# Patient Record
Sex: Female | Born: 1955 | Race: Black or African American | Hispanic: No | Smoking: Former smoker
Health system: Southern US, Community
[De-identification: ages and names within clinical notes are randomized; demographics above are authoritative.]

## PROBLEM LIST (undated history)

## (undated) DIAGNOSIS — M549 Dorsalgia, unspecified: Secondary | ICD-10-CM

## (undated) DIAGNOSIS — I10 Essential (primary) hypertension: Secondary | ICD-10-CM

## (undated) DIAGNOSIS — E66813 Obesity, class 3: Secondary | ICD-10-CM

## (undated) DIAGNOSIS — M1990 Unspecified osteoarthritis, unspecified site: Secondary | ICD-10-CM

## (undated) HISTORY — PX: TUBAL LIGATION: SHX77

## (undated) HISTORY — PX: TONSILLECTOMY: SUR1361

## (undated) HISTORY — DX: Essential (primary) hypertension: I10

## (undated) HISTORY — DX: Obesity, class 3: E66.813

## (undated) HISTORY — DX: Dorsalgia, unspecified: M54.9

## (undated) HISTORY — DX: Morbid (severe) obesity due to excess calories: E66.01

---

## 2005-07-24 ENCOUNTER — Ambulatory Visit: Payer: Self-pay | Admitting: Family Medicine

## 2005-08-02 ENCOUNTER — Ambulatory Visit (HOSPITAL_COMMUNITY): Admission: RE | Admit: 2005-08-02 | Discharge: 2005-08-02 | Payer: Self-pay | Admitting: Family Medicine

## 2005-08-28 ENCOUNTER — Ambulatory Visit: Payer: Self-pay | Admitting: Family Medicine

## 2007-07-18 ENCOUNTER — Encounter: Payer: Self-pay | Admitting: Family Medicine

## 2010-02-14 ENCOUNTER — Ambulatory Visit: Payer: Self-pay | Admitting: Orthopedic Surgery

## 2010-02-14 DIAGNOSIS — M19049 Primary osteoarthritis, unspecified hand: Secondary | ICD-10-CM | POA: Insufficient documentation

## 2010-02-14 DIAGNOSIS — M654 Radial styloid tenosynovitis [de Quervain]: Secondary | ICD-10-CM | POA: Insufficient documentation

## 2010-02-15 ENCOUNTER — Encounter: Payer: Self-pay | Admitting: Orthopedic Surgery

## 2010-03-01 ENCOUNTER — Telehealth: Payer: Self-pay | Admitting: Orthopedic Surgery

## 2010-03-24 ENCOUNTER — Telehealth: Payer: Self-pay | Admitting: Orthopedic Surgery

## 2010-08-16 NOTE — Letter (Signed)
Summary: rpc chart  rpc chart   Imported By: Curtis Sites 04/28/2010 15:57:04  _____________________________________________________________________  External Attachment:    Type:   Image     Comment:   External Document

## 2010-08-16 NOTE — Progress Notes (Signed)
Summary: Hand still hurting  Phone Note Call from Patient   Summary of Call: Julia Avery (June 01, 1956) says her hand is still hurting alot, to the point that she cannot do her daily activities.  The pain medicine is not helping much at all.  She did not bring the brace back to our office because she said she knew it is not the knid you said she needed because the thumb is out on the brace she has.  She used [facility]  Her# [phone] Initial call taken by: Jacklynn Ganong,  March 01, 2010 3:29 PM  Follow-up for Phone Call        okay I need to put her in a rhino brace if she is insured if not she can get one from the drugstore Follow-up by: Fuller Canada MD,  March 01, 2010 3:36 PM  Additional Follow-up for Phone Call Additional follow up Details #1::        she does not have insurance and I advised to get ryno from drug store, she said " and this is supposed to help my thumb pain"? I advised yes with use of Ibuprofen 800 tid and ice, she sounded upset, she said she needed something for pain, just FYI Additional Follow-up by: Ether Griffins,  March 01, 2010 3:41 PM

## 2010-08-16 NOTE — Progress Notes (Signed)
Summary: patient requests injection  Phone Note Call from Patient   Caller: Patient Summary of Call: Patient called to request cortisone injection in hand, as still hurting; states braces not helping much w/work and w/daily activities. Wants to know if injection can be given before she schedules appointment, as she is self pay. Initial call taken by: Cammie Sickle,  March 24, 2010 12:41 PM  Follow-up for Phone Call        this message doesnt make sense  Follow-up by: Fuller Canada MD,  March 24, 2010 1:24 PM  Additional Follow-up for Phone Call Additional follow up Details #1::        I explained to patient that Dr Romeo Apple needs to see hand to evaluate first, and offered appointment. Appt scheduled. Additional Follow-up by: Cammie Sickle,  March 24, 2010 2:07 PM

## 2010-08-16 NOTE — Letter (Signed)
Summary: History form  History form   Imported By: Jacklynn Ganong 02/15/2010 14:19:39  _____________________________________________________________________  External Attachment:    Type:   Image     Comment:   External Document

## 2010-08-16 NOTE — Assessment & Plan Note (Signed)
Summary: RT HAND PAIN CTS/AWARE TO PAY $100/BSF   Visit Type:  new patient  CC:  right hand pain.  History of Present Illness: I saw Julia Avery in the office today for an initial visit.  She is a 55 years old woman with the complaint of:  right hand pain.  Medications: none.  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  She had a history of carpal tunnel syndrome started in 1992 treated with an injection.  She uses her computer at work and complains of numbness and pain over the RIGHT thumb with a sharp throbbing burning sensation.  She reports intensity of 9/10 with constant pain unrelieved by a brace and a glove on the RIGHT hand.  She has trouble with writing, twisting, combing her hair, driving, using a remote.  Her pain was unrelieved by Aleve, Vicodin or Tylenol for  She has noted some swelling some tingling and some locking related to the RIGHT thumb      Allergies (verified): 1)  ! Penicillin  Past History:  Past Surgical History: Tonsillectomy Tubaligation  Review of Systems Constitutional:  Denies weight loss, weight gain, fever, chills, and fatigue. Cardiovascular:  Denies chest pain, palpitations, fainting, and murmurs. Respiratory:  Denies short of breath, wheezing, couch, tightness, pain on inspiration, and snoring . Gastrointestinal:  Complains of nausea and constipation; denies heartburn, vomiting, diarrhea, and blood in your stools. Genitourinary:  Complains of urgency; denies frequency, difficulty urinating, painful urination, flank pain, and bleeding in urine. Neurologic:  Denies numbness, tingling, unsteady gait, dizziness, tremors, and seizure. Musculoskeletal:  Denies joint pain, swelling, instability, stiffness, redness, heat, and muscle pain. Endocrine:  Denies excessive thirst, exessive urination, and heat or cold intolerance. Psychiatric:  Denies nervousness, depression,  anxiety, and hallucinations. Skin:  Denies changes in the skin, poor healing, rash, itching, and redness. HEENT:  Denies blurred or double vision, eye pain, redness, and watering. Immunology:  Denies seasonal allergies, sinus problems, and allergic to bee stings. Hemoatologic:  Denies easy bleeding and brusing.  Physical Exam  Skin:  intact without lesions or rashes Psych:  alert and cooperative; normal mood and affect; normal attention span and concentration   Wrist/Hand Exam  General:    Well-developed, well-nourished, in no acute distress; alert and oriented x 3.    Vital signs are stable  Inspection:    Inspection is normal.    Palpation:    she is tender over the RIGHT thumb over the first compartment shows a positive Finkelstein test decreased range of motion at the basilar joint and also at the metacarpophalangeal joint tenderness at the basal joint positive grinding test  Vascular:    Radial, ulnar, brachial, and axillary pulses 2+ and symmetric; capillary refill less than 2 seconds; no evidence of ischemia, clubbing, or cyanosis.    Sensory:    Gross sensation intact in the upper extremities.    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 1 by mouth bid  Patient Instructions: 1)  please bring brace by for [place] to see today Prescriptions: DICLOFENAC POTASSIUM 50 MG TABS (DICLOFENAC POTASSIUM) 1 by mouth bid  #60 x 1   Entered and Authorized by:   Fuller Canada MD   Signed by:   Fuller Canada MD on 02/14/2010   Method used:   Print then Give to Patient   RxID:   [id number]

## 2012-05-21 ENCOUNTER — Other Ambulatory Visit (HOSPITAL_COMMUNITY)
Admission: RE | Admit: 2012-05-21 | Discharge: 2012-05-21 | Disposition: A | Discharge status: Home / Self Care | Payer: 59 | Source: Ambulatory Visit | Attending: Family Medicine | Admitting: Family Medicine

## 2012-05-21 ENCOUNTER — Encounter: Payer: Self-pay | Admitting: Family Medicine

## 2012-05-21 ENCOUNTER — Ambulatory Visit (INDEPENDENT_AMBULATORY_CARE_PROVIDER_SITE_OTHER): Payer: 59 | Admitting: Family Medicine

## 2012-05-21 VITALS — BP 132/76 | HR 88 | Resp 18 | Ht 62.0 in | Wt 247.1 lb

## 2012-05-21 DIAGNOSIS — R3915 Urgency of urination: Secondary | ICD-10-CM

## 2012-05-21 DIAGNOSIS — Z720 Tobacco use: Secondary | ICD-10-CM | POA: Insufficient documentation

## 2012-05-21 DIAGNOSIS — F17200 Nicotine dependence, unspecified, uncomplicated: Secondary | ICD-10-CM

## 2012-05-21 DIAGNOSIS — E669 Obesity, unspecified: Secondary | ICD-10-CM | POA: Insufficient documentation

## 2012-05-21 DIAGNOSIS — M25559 Pain in unspecified hip: Secondary | ICD-10-CM | POA: Insufficient documentation

## 2012-05-21 DIAGNOSIS — I10 Essential (primary) hypertension: Secondary | ICD-10-CM

## 2012-05-21 DIAGNOSIS — N760 Acute vaginitis: Secondary | ICD-10-CM | POA: Insufficient documentation

## 2012-05-21 DIAGNOSIS — Z113 Encounter for screening for infections with a predominantly sexual mode of transmission: Secondary | ICD-10-CM | POA: Insufficient documentation

## 2012-05-21 LAB — POCT URINALYSIS DIPSTICK
Bilirubin, UA: NEGATIVE
Blood, UA: NEGATIVE
Glucose, UA: NEGATIVE
Ketones, UA: NEGATIVE
Leukocytes, UA: NEGATIVE
Nitrite, UA: NEGATIVE
Protein, UA: NEGATIVE
Spec Grav, UA: 1.02
Urobilinogen, UA: 0.2
pH, UA: 7

## 2012-05-21 MED ORDER — LISINOPRIL-HYDROCHLOROTHIAZIDE 10-12.5 MG PO TABS: 1.0000 | ORAL_TABLET | Freq: Every day | ORAL | Status: DC

## 2012-05-21 NOTE — Assessment & Plan Note (Signed)
Cultures taken, including GC/chlaymdia, discussed condom use, protecting self

## 2012-05-21 NOTE — Assessment & Plan Note (Signed)
likley due to weight exan benign, she has pain on and off, hold on imaging, continue acetaminophen

## 2012-05-21 NOTE — Progress Notes (Signed)
  Subjective:    Patient ID: Julia Avery, female    DOB: 1955-10-07, 56 y.o.   MRN: [id number]  HPI Patient here to establish care. She's not had a PCP in greater than 5 years. She was seen Dr. Roseanne Reno he. No GYN care in greater than 5 years. She did have an appointment with her eye doctor Dr. Mayford Knife 2 weeks ago.  Hypertension she's been on medications for the past 15 years. She had no PCP she's been taking a friend's blood pressure medication Norvasc 5 mg daily.  Pain on right side she's had pain on her right side a strain from her arm all the way down to her leg for the past 6 months. She thinks it is due to her increased weight gain. She has been down to 130 in her adult life but feels comfortable at 170.  Vaginal discharge with itching and odor for the past couple of months. She's also noted urinary urgency and frequency. She often has leaking episodes and she does not make it to the bathroom. She is in relationship with female partner who also has other partners   Review of Systems  GEN- denies fatigue, fever, weight loss,weakness, recent illness HEENT- denies eye drainage, change in vision, nasal discharge, CVS- denies chest pain, palpitations RESP- denies SOB, cough, wheeze ABD- denies N/V, change in stools, abd pain GU- denies dysuria, hematuria, dribbling, incontinence MSK- + joint pain, muscle aches, injury Neuro- denies headache, dizziness, syncope, seizure activity      Objective:   Physical Exam GEN- NAD, alert and oriented x3, obese HEENT- PERRL, EOMI, non injected sclera, pink conjunctiva, MMM, oropharynx clear Neck- Supple,  CVS- RRR, no murmur RESP-CTAB ABS-NABS,soft,NT,ND, no CVA tenderness MSK normal ROM HIP, neg SLR back, spine NT GEN- NAD, alert and oriented, Neck- supple, no thyromegaly GU- normal external genitalia, Skin- hypopigemenation at gluteal crease and skin below vulva, vaginal mucosa pink and moist, cervix visualized no growth, no blood form os,  +discharge, no CMT, no ovarian masses, uterus normal size EXT- No edema Pulses- Radial 2+ Psych-normal affect and Mood         Assessment & Plan:

## 2012-05-21 NOTE — Assessment & Plan Note (Signed)
Check fasting labs, start HCTZ-lisinopril

## 2012-05-21 NOTE — Patient Instructions (Signed)
Get the labs done today- we will discuss at next visit Start new blood pressure pill-  Stop the norvasc You need to quit smoking! Schedule PAP Smear in 4 weeks, will f/u BP

## 2012-05-21 NOTE — Assessment & Plan Note (Signed)
She has lost significant amount of weight in the past. She is motivated to lose weight however is always he's diet pills in the past. She last used Phen-Fen and lost 100 pounds. She is asking for diet pill today. I will review her labs and have her return for her physical exam. That time we'll discuss starting phentermine

## 2012-05-21 NOTE — Assessment & Plan Note (Signed)
UA neg, mild incontinence, f/u labs no meds at this time, not a daily occurrence for her

## 2012-05-23 MED ORDER — METRONIDAZOLE 500 MG PO TABS: 500.0000 mg | ORAL_TABLET | Freq: Two times a day (BID) | ORAL | Status: AC

## 2012-05-23 NOTE — Addendum Note (Signed)
Addended by: Milinda Antis F on: 05/23/2012 05:15 PM   Modules accepted: Orders

## 2012-06-03 ENCOUNTER — Telehealth: Payer: Self-pay | Admitting: Family Medicine

## 2012-06-03 NOTE — Telephone Encounter (Signed)
Patient states that she took blood pressure medicine prescribed on 11/5 for 7 days and she had nausea, fatigue, feelings of faintness, and cold sweats.  She stopped the medicine for 2 days and them symptoms subsided.  She restarted the medicine today and the symptoms returned.  Advised to hold medicine until further contacted.

## 2012-06-04 MED ORDER — AMLODIPINE BESYLATE 5 MG PO TABS: 5.0000 mg | ORAL_TABLET | Freq: Every day | ORAL | Status: DC

## 2012-06-04 NOTE — Addendum Note (Signed)
Addended by: Milinda Antis F on: 06/04/2012 12:46 PM   Modules accepted: Orders

## 2012-06-04 NOTE — Telephone Encounter (Signed)
Stop lisinopril HCTZ, start norvasc 5

## 2012-06-04 NOTE — Telephone Encounter (Signed)
Patient aware.

## 2012-06-18 ENCOUNTER — Ambulatory Visit: Payer: 59 | Admitting: Family Medicine

## 2012-06-28 ENCOUNTER — Ambulatory Visit: Payer: 59 | Admitting: Family Medicine

## 2012-11-07 ENCOUNTER — Ambulatory Visit (INDEPENDENT_AMBULATORY_CARE_PROVIDER_SITE_OTHER): Payer: 59 | Admitting: Family Medicine

## 2012-11-07 ENCOUNTER — Encounter: Payer: Self-pay | Admitting: Family Medicine

## 2012-11-07 ENCOUNTER — Ambulatory Visit (HOSPITAL_COMMUNITY)
Admission: RE | Admit: 2012-11-07 | Discharge: 2012-11-07 | Disposition: A | Discharge status: Home / Self Care | Payer: 59 | Source: Ambulatory Visit | Attending: Family Medicine | Admitting: Family Medicine

## 2012-11-07 VITALS — BP 134/76 | HR 84 | Resp 18 | Ht 62.0 in | Wt 252.0 lb

## 2012-11-07 DIAGNOSIS — R7989 Other specified abnormal findings of blood chemistry: Secondary | ICD-10-CM

## 2012-11-07 DIAGNOSIS — I10 Essential (primary) hypertension: Secondary | ICD-10-CM

## 2012-11-07 DIAGNOSIS — M79609 Pain in unspecified limb: Secondary | ICD-10-CM | POA: Insufficient documentation

## 2012-11-07 DIAGNOSIS — R791 Abnormal coagulation profile: Secondary | ICD-10-CM

## 2012-11-07 DIAGNOSIS — M25569 Pain in unspecified knee: Secondary | ICD-10-CM

## 2012-11-07 NOTE — Progress Notes (Signed)
  Subjective:    Patient ID: Julia Avery, female    DOB: 1955-11-27, 57 y.o.   MRN: [id number]  HPI   patient presents with muscle pain and aches for the past couple weeks. She was evaluated in emergency room labs were unremarkable with exception of elevated d-dimer she was given one injection of Lovenox and told to followup with her primary care for ultrasound of the legs however this was 9 days ago. She continues to have pain in her legs refill very sore when she gets up to move around. She denies any injury denies back pain hip pain burning or tingling in the feet. No new medications. Of note she recently restarted her blood pressure pill 5 days ago  Review of Systems  GEN- denies fatigue, fever, weight loss,weakness, recent illness HEENT- denies eye drainage, change in vision, nasal discharge, CVS- denies chest pain, palpitations RESP- denies SOB, cough, wheeze ABD- denies N/V, change in stools, abd pain GU- denies dysuria, hematuria, dribbling, incontinence MSK- denies joint pain,+ muscle aches, injury Neuro- denies headache, dizziness, syncope, seizure activity      Objective:   Physical Exam GEN- NAD, alert and oriented x3 HEENT- PERRL, EOMI, non injected sclera, pink conjunctiva, MMM, oropharynx clear CVS- RRR, no murmur RESP-CTAB EXT- No edema, TTP Left calf > R, mild TTP bilateral thighs, Skin- no skin lesions lower legs Pulses- Radial, DP- 2+        Assessment & Plan:

## 2012-11-07 NOTE — Patient Instructions (Addendum)
Get the ultrasound done today We will call about the blood pressure medication You can take advil ibuprofen for your legs F/U 2 weeks for physical with PAP Smear - Do not eat after midnight

## 2012-11-10 DIAGNOSIS — M25569 Pain in unspecified knee: Secondary | ICD-10-CM | POA: Insufficient documentation

## 2012-11-10 NOTE — Assessment & Plan Note (Signed)
Elevated d dimer, bilateral legs, no DVT Possible due to muscle cramps/ overuse Other labs unremarkable from [facility], start on NSAIDS Recheck at f/u in 2 weeks

## 2012-11-10 NOTE — Assessment & Plan Note (Signed)
Called to verify with pharmacy only paying $6 for meds, continue, fasting labs due

## 2012-11-11 ENCOUNTER — Telehealth: Payer: Self-pay | Admitting: Family Medicine

## 2012-11-11 DIAGNOSIS — M7918 Myalgia, other site: Secondary | ICD-10-CM

## 2012-11-11 DIAGNOSIS — I10 Essential (primary) hypertension: Secondary | ICD-10-CM

## 2012-11-12 ENCOUNTER — Telehealth: Payer: Self-pay | Admitting: Family Medicine

## 2012-11-13 NOTE — Telephone Encounter (Signed)
advil not helping. Wants something else for her leg pain

## 2012-11-14 MED ORDER — CYCLOBENZAPRINE HCL 5 MG PO TABS: 5.0000 mg | ORAL_TABLET | Freq: Three times a day (TID) | ORAL | Status: DC | PRN

## 2012-11-14 MED ORDER — TRAMADOL HCL 50 MG PO TABS: 50.0000 mg | ORAL_TABLET | Freq: Four times a day (QID) | ORAL | Status: DC | PRN

## 2012-11-14 NOTE — Telephone Encounter (Signed)
Please have pt schedule appt for her physical and leg pain She needs to get the labs done before the visit and needs to be fasting, this will look at other reasons for her leg pain Ultram sent for pain and flexeril

## 2012-11-14 NOTE — Telephone Encounter (Signed)
Spoke with patient and she is aware of meds being sent in to pharmacy.  She will try this and see if it elevates pain

## 2012-12-03 ENCOUNTER — Other Ambulatory Visit: Payer: Self-pay | Admitting: Family Medicine

## 2012-12-06 ENCOUNTER — Telehealth: Payer: Self-pay | Admitting: Family Medicine

## 2012-12-06 NOTE — Telephone Encounter (Signed)
Left message for patient to contact pharmacy.  Refill sent on 5/20

## 2012-12-10 ENCOUNTER — Other Ambulatory Visit (HOSPITAL_COMMUNITY)
Admission: RE | Admit: 2012-12-10 | Discharge: 2012-12-10 | Disposition: A | Discharge status: Home / Self Care | Payer: 59 | Source: Ambulatory Visit | Attending: Family Medicine | Admitting: Family Medicine

## 2012-12-10 ENCOUNTER — Encounter: Payer: Self-pay | Admitting: Family Medicine

## 2012-12-10 ENCOUNTER — Ambulatory Visit (INDEPENDENT_AMBULATORY_CARE_PROVIDER_SITE_OTHER): Payer: 59 | Admitting: Family Medicine

## 2012-12-10 VITALS — BP 120/86 | HR 82 | Resp 16 | Wt 245.1 lb

## 2012-12-10 DIAGNOSIS — Z01419 Encounter for gynecological examination (general) (routine) without abnormal findings: Secondary | ICD-10-CM | POA: Insufficient documentation

## 2012-12-10 DIAGNOSIS — M25569 Pain in unspecified knee: Secondary | ICD-10-CM

## 2012-12-10 DIAGNOSIS — IMO0001 Reserved for inherently not codable concepts without codable children: Secondary | ICD-10-CM

## 2012-12-10 DIAGNOSIS — I10 Essential (primary) hypertension: Secondary | ICD-10-CM

## 2012-12-10 DIAGNOSIS — M25561 Pain in right knee: Secondary | ICD-10-CM

## 2012-12-10 DIAGNOSIS — Z1231 Encounter for screening mammogram for malignant neoplasm of breast: Secondary | ICD-10-CM

## 2012-12-10 DIAGNOSIS — M791 Myalgia, unspecified site: Secondary | ICD-10-CM

## 2012-12-10 DIAGNOSIS — Z1211 Encounter for screening for malignant neoplasm of colon: Secondary | ICD-10-CM

## 2012-12-10 DIAGNOSIS — M25562 Pain in left knee: Secondary | ICD-10-CM

## 2012-12-10 DIAGNOSIS — Z124 Encounter for screening for malignant neoplasm of cervix: Secondary | ICD-10-CM

## 2012-12-10 MED ORDER — CYCLOBENZAPRINE HCL 5 MG PO TABS: 5.0000 mg | ORAL_TABLET | Freq: Three times a day (TID) | ORAL | Status: AC | PRN

## 2012-12-10 MED ORDER — TRAMADOL HCL 50 MG PO TABS: ORAL_TABLET | ORAL | Status: DC

## 2012-12-10 NOTE — Assessment & Plan Note (Signed)
Patient here for physical exam. Pap smear was done. She's been referred for colonoscopy. Mammogram is to be set up. She will return for fasting labs. Her Pap smear was normal she does not need another one for the next 2-3 years. Tetanus booster is up-to-date

## 2012-12-10 NOTE — Assessment & Plan Note (Signed)
Bilateral knee pain will obtain plain films
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
## 2012-12-10 NOTE — Assessment & Plan Note (Signed)
She has lost 7 pounds since her last visit continue to work on activity as tolerated

## 2012-12-10 NOTE — Assessment & Plan Note (Signed)
She continues to have joint pain and muscle aches. He advised her the importance of getting her labs done which will include anti-inflammatory markers. She's been using too much medication she states help her jittery the day. She will continue the muscle relaxant as prescribed also increase her tramadol to 100 mg every 6 hours as needed. I will not refill the hydrocodone at this time that she received from the emergency department

## 2012-12-10 NOTE — Patient Instructions (Signed)
Continue to work on weight loss He can take 1-2 tablets of tramadol every 6 hours as needed Continue muscle relaxants as needed  get the get the x-rays done of your knees at [facility] Please schedule your mammogram He will have a referral to GI for a colonoscopy Get the labs done fasting, this is for both your physical and your leg pain F/U 4 months

## 2012-12-10 NOTE — Assessment & Plan Note (Signed)
Myalgias in her legs per above, she has no change in her tone or neurological exam this may be due to obesity versus some referred pain from her knees

## 2012-12-10 NOTE — Assessment & Plan Note (Signed)
Blood pressure much improved 

## 2012-12-16 ENCOUNTER — Encounter: Payer: Self-pay | Admitting: Family Medicine

## 2012-12-18 ENCOUNTER — Telehealth: Payer: Self-pay

## 2012-12-18 NOTE — Telephone Encounter (Signed)
Called pt to schedule her colonoscopy. She said she is having problems with her insurance at this time, and she will call when she is ready. Said she is not having any GI problems, she just needs to have a screening colonoscopy.

## 2013-01-01 NOTE — Telephone Encounter (Signed)
Reminder sent to call when ready.

## 2013-03-27 ENCOUNTER — Telehealth: Payer: Self-pay | Admitting: Family Medicine

## 2013-03-27 NOTE — Telephone Encounter (Signed)
Ok to refill 

## 2013-03-27 NOTE — Telephone Encounter (Signed)
Tramadol HCL 50 mg tab 1-2 q6 hours prn pain #90 last rf 02/04/13

## 2013-03-28 MED ORDER — TRAMADOL HCL 50 MG PO TABS: ORAL_TABLET | ORAL | Status: DC

## 2013-03-28 NOTE — Telephone Encounter (Signed)
Okay to refill? 

## 2013-03-28 NOTE — Telephone Encounter (Signed)
Meds refilled.

## 2013-07-18 ENCOUNTER — Encounter: Payer: Self-pay | Admitting: Family Medicine

## 2013-07-18 ENCOUNTER — Telehealth: Payer: Self-pay | Admitting: Family Medicine

## 2013-07-18 MED ORDER — AMLODIPINE BESYLATE 5 MG PO TABS: 5.0000 mg | ORAL_TABLET | Freq: Every day | ORAL | Status: DC

## 2013-07-18 NOTE — Telephone Encounter (Signed)
Medication refill for one time only.  Patient needs to be seen.  Letter sent for patient to call and schedule 

## 2014-03-27 ENCOUNTER — Telehealth: Payer: Self-pay | Admitting: *Deleted

## 2014-03-27 ENCOUNTER — Ambulatory Visit (INDEPENDENT_AMBULATORY_CARE_PROVIDER_SITE_OTHER): Payer: Self-pay | Admitting: Family Medicine

## 2014-03-27 ENCOUNTER — Encounter: Payer: Self-pay | Admitting: Family Medicine

## 2014-03-27 VITALS — BP 148/76 | HR 72 | Temp 98.2°F | Resp 14 | Ht 61.0 in | Wt 256.0 lb

## 2014-03-27 DIAGNOSIS — S8000XA Contusion of unspecified knee, initial encounter: Secondary | ICD-10-CM

## 2014-03-27 DIAGNOSIS — M549 Dorsalgia, unspecified: Secondary | ICD-10-CM | POA: Insufficient documentation

## 2014-03-27 DIAGNOSIS — S8002XD Contusion of left knee, subsequent encounter: Secondary | ICD-10-CM

## 2014-03-27 DIAGNOSIS — M25559 Pain in unspecified hip: Secondary | ICD-10-CM

## 2014-03-27 DIAGNOSIS — M25551 Pain in right hip: Secondary | ICD-10-CM

## 2014-03-27 DIAGNOSIS — Z5189 Encounter for other specified aftercare: Secondary | ICD-10-CM

## 2014-03-27 DIAGNOSIS — M5430 Sciatica, unspecified side: Secondary | ICD-10-CM

## 2014-03-27 DIAGNOSIS — M5441 Lumbago with sciatica, right side: Secondary | ICD-10-CM

## 2014-03-27 MED ORDER — AMLODIPINE BESYLATE 5 MG PO TABS: 5.0000 mg | ORAL_TABLET | Freq: Every day | ORAL | Status: DC

## 2014-03-27 MED ORDER — HYDROCODONE-ACETAMINOPHEN 7.5-325 MG PO TABS: 1.0000 | ORAL_TABLET | Freq: Four times a day (QID) | ORAL | Status: DC | PRN

## 2014-03-27 MED ORDER — MELOXICAM 15 MG PO TABS: 15.0000 mg | ORAL_TABLET | Freq: Every day | ORAL | Status: DC

## 2014-03-27 MED ORDER — CYCLOBENZAPRINE HCL 10 MG PO TABS: 10.0000 mg | ORAL_TABLET | Freq: Three times a day (TID) | ORAL | Status: DC | PRN

## 2014-03-27 NOTE — Telephone Encounter (Signed)
Pt called stating needing meds refilled came in for office visit today, refilled 90 day suppley with 1 refill
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
## 2014-03-27 NOTE — Patient Instructions (Addendum)
Release of records-- [facility] ER from [place] of back and hip at [facility] on Light Duty  F/U 4 weeks

## 2014-03-28 DIAGNOSIS — S8002XA Contusion of left knee, initial encounter: Secondary | ICD-10-CM | POA: Insufficient documentation

## 2014-03-28 NOTE — Assessment & Plan Note (Signed)
persistant pain though her gait has improved, will obtain xray of back and hip based on exam and mechanism of fall with the trauma Continue meloxicam and trial of flexeril due to cost of skelaxin Hydrocodone for bedtime use for pain Hold on neurosurgery referral

## 2014-03-28 NOTE — Assessment & Plan Note (Signed)
Improved without intervention, I do not think ortho is needed at this time, her knee is no longer giving her any significant pain
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
## 2014-03-31 ENCOUNTER — Telehealth: Payer: Self-pay | Admitting: *Deleted

## 2014-03-31 NOTE — Telephone Encounter (Signed)
Received call from Jeannetta Ellis, [employer] of [employer]. [phone].  Reports that letter received from MD for light duty was received. Requested more in depth letter about what patient can perform while on light duty and how many hours patient can work.   Advised to fax a copy of patient job description and responsibilities.   MD to be made aware.

## 2014-03-31 NOTE — Telephone Encounter (Signed)
Will send new letter, once job description sent to me

## 2014-04-01 ENCOUNTER — Encounter: Payer: Self-pay | Admitting: Family Medicine

## 2014-04-10 ENCOUNTER — Encounter: Payer: Self-pay | Admitting: Family Medicine

## 2014-04-10 ENCOUNTER — Telehealth: Payer: Self-pay | Admitting: Family Medicine

## 2014-04-10 NOTE — Telephone Encounter (Signed)
I have called the pt twice over the past couple days using all numbers provided, Home number is not in service Also attempted the number for [employer], regarding her work restrictions.  I will fax in a new note with same restrictions with exception of ability to work 8 hours now

## 2014-04-24 ENCOUNTER — Ambulatory Visit: Payer: Self-pay | Admitting: Family Medicine

## 2014-04-27 ENCOUNTER — Ambulatory Visit (INDEPENDENT_AMBULATORY_CARE_PROVIDER_SITE_OTHER): Payer: Self-pay | Admitting: Family Medicine

## 2014-04-27 ENCOUNTER — Encounter: Payer: Self-pay | Admitting: Family Medicine

## 2014-04-27 VITALS — BP 136/74 | HR 72 | Temp 98.3°F | Resp 14 | Ht 62.0 in | Wt 252.0 lb

## 2014-04-27 DIAGNOSIS — M5441 Lumbago with sciatica, right side: Secondary | ICD-10-CM

## 2014-04-27 DIAGNOSIS — I10 Essential (primary) hypertension: Secondary | ICD-10-CM

## 2014-04-27 LAB — CBC WITH DIFFERENTIAL/PLATELET
Basophils Absolute: 0.1 10*3/uL (ref 0.0–0.1)
Basophils Relative: 1 % (ref 0–1)
Eosinophils Absolute: 0.5 10*3/uL (ref 0.0–0.7)
Eosinophils Relative: 7 % — ABNORMAL HIGH (ref 0–5)
HCT: 40 % (ref 36.0–46.0)
Hemoglobin: 13.7 g/dL (ref 12.0–15.0)
Lymphocytes Relative: 42 % (ref 12–46)
Lymphs Abs: 3.1 10*3/uL (ref 0.7–4.0)
MCH: 29.8 pg (ref 26.0–34.0)
MCHC: 34.3 g/dL (ref 30.0–36.0)
MCV: 87.1 fL (ref 78.0–100.0)
Monocytes Absolute: 0.6 10*3/uL (ref 0.1–1.0)
Monocytes Relative: 8 % (ref 3–12)
Neutro Abs: 3.1 10*3/uL (ref 1.7–7.7)
Neutrophils Relative %: 42 % — ABNORMAL LOW (ref 43–77)
Platelets: 432 10*3/uL — ABNORMAL HIGH (ref 150–400)
RBC: 4.59 MIL/uL (ref 3.87–5.11)
RDW: 15.8 % — ABNORMAL HIGH (ref 11.5–15.5)
WBC: 7.3 10*3/uL (ref 4.0–10.5)

## 2014-04-27 LAB — LIPID PANEL
Cholesterol: 173 mg/dL (ref 0–200)
HDL: 62 mg/dL (ref >39)
LDL Cholesterol: 100 mg/dL — ABNORMAL HIGH (ref 0–99)
Total CHOL/HDL Ratio: 2.8 Ratio
Triglycerides: 57 mg/dL (ref <150)
VLDL: 11 mg/dL (ref 0–40)

## 2014-04-27 LAB — COMPREHENSIVE METABOLIC PANEL
ALT: 25 U/L (ref 0–35)
AST: 21 U/L (ref 0–37)
Albumin: 4.4 g/dL (ref 3.5–5.2)
Alkaline Phosphatase: 108 U/L (ref 39–117)
BUN: 12 mg/dL (ref 6–23)
CO2: 28 mEq/L (ref 19–32)
Calcium: 9.5 mg/dL (ref 8.4–10.5)
Chloride: 104 mEq/L (ref 96–112)
Creat: 0.71 mg/dL (ref 0.50–1.10)
Glucose, Bld: 94 mg/dL (ref 70–99)
Potassium: 3.8 mEq/L (ref 3.5–5.3)
Sodium: 143 mEq/L (ref 135–145)
Total Bilirubin: 0.3 mg/dL (ref 0.2–1.2)
Total Protein: 7.2 g/dL (ref 6.0–8.3)

## 2014-04-27 MED ORDER — CYCLOBENZAPRINE HCL 10 MG PO TABS: 10.0000 mg | ORAL_TABLET | Freq: Three times a day (TID) | ORAL | Status: DC | PRN

## 2014-04-27 MED ORDER — MELOXICAM 15 MG PO TABS: 15.0000 mg | ORAL_TABLET | Freq: Every day | ORAL | Status: DC

## 2014-04-27 MED ORDER — AMLODIPINE BESYLATE 5 MG PO TABS: 5.0000 mg | ORAL_TABLET | Freq: Every day | ORAL | Status: DC
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
## 2014-04-27 NOTE — Patient Instructions (Addendum)
Continue current medicatons  F/U 6 months

## 2014-04-27 NOTE — Assessment & Plan Note (Signed)
ROM and pain improved, release back to work , no resctrictions, continue muscle relaxer prn and NSAIDS

## 2014-04-27 NOTE — Assessment & Plan Note (Signed)
Well controlled 

## 2014-04-28 ENCOUNTER — Encounter: Payer: Self-pay | Admitting: *Deleted

## 2014-05-05 ENCOUNTER — Encounter: Payer: Self-pay | Admitting: Family Medicine

## 2014-05-14 ENCOUNTER — Telehealth: Payer: Self-pay | Admitting: Family Medicine

## 2014-05-14 NOTE — Telephone Encounter (Signed)
Okay to give norco 5-325mg  1 po q 4hours prn pain #30

## 2014-05-14 NOTE — Telephone Encounter (Signed)
[phone] Pt is needing a prescription for a pain pill (what she was given last time is fine)

## 2014-05-14 NOTE — Telephone Encounter (Signed)
MD please advise

## 2014-05-15 MED ORDER — HYDROCODONE-ACETAMINOPHEN 5-325 MG PO TABS: 1.0000 | ORAL_TABLET | ORAL | Status: DC | PRN

## 2014-05-15 NOTE — Telephone Encounter (Signed)
RX printed, pt aware

## 2014-06-18 ENCOUNTER — Telehealth: Payer: Self-pay | Admitting: Family Medicine

## 2014-06-18 ENCOUNTER — Other Ambulatory Visit: Payer: Self-pay | Admitting: Family Medicine

## 2014-06-18 NOTE — Telephone Encounter (Signed)
Ok to refill??  Last office visit 04/27/2014.  Last refill 05/15/2014.

## 2014-06-18 NOTE — Telephone Encounter (Signed)
Ok to refill??  Last office visit/ refill 04/27/2014, #1 refill.

## 2014-06-18 NOTE — Telephone Encounter (Signed)
Patient is calling to get rx for her hydrocodone  [phone]

## 2014-06-19 MED ORDER — HYDROCODONE-ACETAMINOPHEN 5-325 MG PO TABS: 1.0000 | ORAL_TABLET | ORAL | Status: DC | PRN

## 2014-06-19 NOTE — Telephone Encounter (Signed)
Call placed to patient and patient made aware.  

## 2014-06-19 NOTE — Telephone Encounter (Signed)
RX called in .

## 2014-06-19 NOTE — Telephone Encounter (Signed)
Prescription printed and patient made aware to come to office to pick up.  

## 2014-06-19 NOTE — Telephone Encounter (Signed)
Okay to refill? 

## 2014-06-19 NOTE — Telephone Encounter (Signed)
Okay to give this refill, advise her to try to taper off the pain medications, and use the NSAIDS as needed,

## 2014-08-02 ENCOUNTER — Other Ambulatory Visit: Payer: Self-pay | Admitting: Family Medicine

## 2014-08-03 NOTE — Telephone Encounter (Signed)
Refill appropriate and filled per protocol. 

## 2014-10-27 ENCOUNTER — Ambulatory Visit (INDEPENDENT_AMBULATORY_CARE_PROVIDER_SITE_OTHER): Payer: Self-pay | Admitting: Family Medicine

## 2014-10-27 ENCOUNTER — Encounter: Payer: Self-pay | Admitting: Family Medicine

## 2014-10-27 VITALS — BP 128/62 | HR 84 | Temp 98.3°F | Resp 16 | Ht 62.0 in | Wt 258.0 lb

## 2014-10-27 DIAGNOSIS — M25569 Pain in unspecified knee: Secondary | ICD-10-CM

## 2014-10-27 DIAGNOSIS — M7552 Bursitis of left shoulder: Secondary | ICD-10-CM

## 2014-10-27 DIAGNOSIS — I10 Essential (primary) hypertension: Secondary | ICD-10-CM

## 2014-10-27 MED ORDER — MELOXICAM 15 MG PO TABS: 15.0000 mg | ORAL_TABLET | Freq: Every day | ORAL | Status: DC

## 2014-10-27 MED ORDER — HYDROCODONE-ACETAMINOPHEN 5-325 MG PO TABS: 1.0000 | ORAL_TABLET | ORAL | Status: DC | PRN

## 2014-10-27 MED ORDER — AMLODIPINE BESYLATE 5 MG PO TABS: 5.0000 mg | ORAL_TABLET | Freq: Every day | ORAL | Status: DC

## 2014-10-27 MED ORDER — CYCLOBENZAPRINE HCL 10 MG PO TABS: 10.0000 mg | ORAL_TABLET | Freq: Three times a day (TID) | ORAL | Status: DC | PRN

## 2014-10-27 MED ORDER — METHYLPREDNISOLONE (PAK) 4 MG PO TABS: ORAL_TABLET | ORAL | Status: DC
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
## 2014-10-27 NOTE — Assessment & Plan Note (Signed)
Discussed need for dietary changes, she wants to start weight watchers, which I agree with Weight loss will also help her joint pain

## 2014-10-27 NOTE — Patient Instructions (Addendum)
Work on low fat diet, portion control Drink 8 cups of water Medrol dosepak Try the Ice the shoulder  Call if not better   F/U 6 months

## 2014-10-27 NOTE — Assessment & Plan Note (Signed)
persistant pain though improved, no back pain currently, Initially I considered MRI but she doing all of her regular activities so we will hold off and she can use of meds as needed

## 2014-10-27 NOTE — Assessment & Plan Note (Signed)
Well controlled 

## 2014-11-03 ENCOUNTER — Telehealth: Payer: Self-pay | Admitting: *Deleted

## 2014-11-03 DIAGNOSIS — M7551 Bursitis of right shoulder: Secondary | ICD-10-CM

## 2014-11-03 DIAGNOSIS — M25512 Pain in left shoulder: Secondary | ICD-10-CM

## 2014-11-03 NOTE — Telephone Encounter (Signed)
Pt called stating was seen last Tuesday with pain in left arm, states she is still having pain in left arm and does not seem to get any relief even when she takes pain medication. Wants to know what needs to be done. Please advise!

## 2014-11-03 NOTE — Telephone Encounter (Signed)
Referral to ortho, for left shoulder pain, bursitis

## 2014-11-04 NOTE — Telephone Encounter (Signed)
Pt aware referring to ortho, pt wants to go to someone in [place]  Referral placed to Orthopedic

## 2014-11-11 ENCOUNTER — Telehealth: Payer: Self-pay | Admitting: *Deleted

## 2014-11-11 NOTE — Telephone Encounter (Signed)
pt has appt scheduled at [facility] on May 4 at 2pm with Dr. Case, if pt does not have insurance she will need to pay the 125.00 dollar downpayment at time of service. [place]

## 2014-11-16 NOTE — Telephone Encounter (Signed)
lmtrc

## 2014-11-30 ENCOUNTER — Encounter: Payer: Self-pay | Admitting: Family Medicine

## 2015-01-13 ENCOUNTER — Encounter: Payer: Self-pay | Admitting: Family Medicine

## 2015-03-26 ENCOUNTER — Telehealth: Payer: Self-pay | Admitting: Family Medicine

## 2015-03-26 MED ORDER — HYDROCODONE-ACETAMINOPHEN 5-325 MG PO TABS
1.0000 | ORAL_TABLET | ORAL | Status: DC | PRN
Start: 2015-03-26 — End: 2015-04-28

## 2015-03-26 NOTE — Telephone Encounter (Signed)
Prescription printed and patient made aware to come to office to pick up before 5 pm on 03/26/2015 per VM.

## 2015-03-26 NOTE — Telephone Encounter (Signed)
Okay to refill? 

## 2015-03-26 NOTE — Telephone Encounter (Signed)
Ok to refill??  Last office visit/ refill 10/27/2014.

## 2015-03-26 NOTE — Telephone Encounter (Signed)
Patient missed appt but needs rx for her hydrocodone if possible she rescheduled  [phone]

## 2015-03-29 ENCOUNTER — Other Ambulatory Visit: Payer: Self-pay | Admitting: Family Medicine

## 2015-03-29 NOTE — Telephone Encounter (Signed)
Okay to refill give 2 

## 2015-03-29 NOTE — Telephone Encounter (Signed)
Prescription sent to pharmacy.

## 2015-03-29 NOTE — Telephone Encounter (Signed)
Ok to refill 

## 2015-04-28 ENCOUNTER — Encounter: Payer: Self-pay | Admitting: Family Medicine

## 2015-04-28 ENCOUNTER — Ambulatory Visit (INDEPENDENT_AMBULATORY_CARE_PROVIDER_SITE_OTHER): Payer: Self-pay | Admitting: Family Medicine

## 2015-04-28 VITALS — BP 128/78 | HR 80 | Temp 98.2°F | Resp 16 | Ht 62.0 in | Wt 259.0 lb

## 2015-04-28 DIAGNOSIS — J209 Acute bronchitis, unspecified: Secondary | ICD-10-CM

## 2015-04-28 DIAGNOSIS — Z720 Tobacco use: Secondary | ICD-10-CM

## 2015-04-28 DIAGNOSIS — I10 Essential (primary) hypertension: Secondary | ICD-10-CM

## 2015-04-28 DIAGNOSIS — M25561 Pain in right knee: Secondary | ICD-10-CM

## 2015-04-28 DIAGNOSIS — M25562 Pain in left knee: Secondary | ICD-10-CM

## 2015-04-28 MED ORDER — HYDROCODONE-ACETAMINOPHEN 7.5-325 MG PO TABS: 1.0000 | ORAL_TABLET | Freq: Four times a day (QID) | ORAL | Status: DC | PRN

## 2015-04-28 MED ORDER — METHOCARBAMOL 500 MG PO TABS: 500.0000 mg | ORAL_TABLET | Freq: Three times a day (TID) | ORAL | Status: DC | PRN

## 2015-04-28 MED ORDER — AZITHROMYCIN 250 MG PO TABS: ORAL_TABLET | ORAL | Status: DC

## 2015-04-28 NOTE — Assessment & Plan Note (Signed)
Pressure controlled. She needs fasting labs however she is uninsured she is signing up for insurance during open enrollment we will do these at her next visit

## 2015-04-28 NOTE — Patient Instructions (Addendum)
Robitussin DM  Take antibiotics Use Vape Pain medication- last refill New muscle relaxer Robaxin  Note for work - out tomorrow, can return on Friday F/U 6 months
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
## 2015-06-18 ENCOUNTER — Telehealth: Payer: Self-pay | Admitting: Family Medicine

## 2015-06-18 NOTE — Telephone Encounter (Signed)
Patient states that the muscle relaxer's you prescribed to her isn't helping with the pain and she is requesting a pain medication. Please advise.

## 2015-06-18 NOTE — Telephone Encounter (Signed)
Patient needs OV.   Call placed to patient to attempt to schedule OV.   No answer and no VM.

## 2015-07-26 ENCOUNTER — Ambulatory Visit: Payer: Self-pay | Admitting: Family Medicine

## 2015-08-09 ENCOUNTER — Ambulatory Visit: Payer: Self-pay | Admitting: Family Medicine

## 2015-08-13 ENCOUNTER — Ambulatory Visit (INDEPENDENT_AMBULATORY_CARE_PROVIDER_SITE_OTHER): Payer: BLUE CROSS/BLUE SHIELD | Admitting: Family Medicine

## 2015-08-13 ENCOUNTER — Encounter: Payer: Self-pay | Admitting: Family Medicine

## 2015-08-13 VITALS — BP 140/72 | HR 88 | Temp 99.0°F | Resp 16 | Ht 62.0 in | Wt 256.0 lb

## 2015-08-13 DIAGNOSIS — M5431 Sciatica, right side: Secondary | ICD-10-CM | POA: Diagnosis not present

## 2015-08-13 DIAGNOSIS — G8929 Other chronic pain: Secondary | ICD-10-CM | POA: Insufficient documentation

## 2015-08-13 DIAGNOSIS — M549 Dorsalgia, unspecified: Secondary | ICD-10-CM | POA: Insufficient documentation

## 2015-08-13 MED ORDER — OXYCODONE-ACETAMINOPHEN 5-325 MG PO TABS: 1.0000 | ORAL_TABLET | Freq: Four times a day (QID) | ORAL | Status: DC | PRN

## 2015-08-13 MED ORDER — DIAZEPAM 5 MG PO TABS: ORAL_TABLET | ORAL | Status: DC

## 2015-08-13 MED ORDER — DICLOFENAC SODIUM 75 MG PO TBEC
75.0000 mg | DELAYED_RELEASE_TABLET | Freq: Two times a day (BID) | ORAL | Status: DC
Start: 2015-08-13 — End: 2015-10-18
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
## 2015-08-13 NOTE — Assessment & Plan Note (Signed)
Recurrent episodes of pain,  The mobility. I'm going to proceed with MRI of the lumbar spine. I reviewed her prescriptions for Percocet. We will stop muscle laxity is just causing oversedation with pain. Also give her diclofenac as an anti-inflammatory she can stop the over-the-counter NSAIDs. She was given Valium to take an hour before her MRI.

## 2015-08-31 ENCOUNTER — Encounter: Payer: Self-pay | Admitting: Family Medicine

## 2015-09-01 ENCOUNTER — Ambulatory Visit (HOSPITAL_COMMUNITY): Payer: BLUE CROSS/BLUE SHIELD

## 2015-09-14 ENCOUNTER — Ambulatory Visit (HOSPITAL_COMMUNITY)
Admission: RE | Admit: 2015-09-14 | Discharge: 2015-09-14 | Disposition: A | Discharge status: Home / Self Care | Payer: BLUE CROSS/BLUE SHIELD | Source: Ambulatory Visit | Attending: Family Medicine | Admitting: Family Medicine

## 2015-09-14 ENCOUNTER — Telehealth: Payer: Self-pay | Admitting: Family Medicine

## 2015-09-14 DIAGNOSIS — M5431 Sciatica, right side: Secondary | ICD-10-CM

## 2015-09-14 NOTE — Telephone Encounter (Signed)
Patient is calling to say she could not do mri at [facility], she had a panic attack, would like to speak to you regarding this  [phone]

## 2015-09-14 NOTE — Telephone Encounter (Signed)
Call placed to patient.   Reports that she was too claustrophobic to be able to have closed MRI, even with medication.   [facility] recommended open MRI in [place].   MD please advise.

## 2015-09-14 NOTE — Telephone Encounter (Signed)
Reschedule at open MRI

## 2015-09-15 NOTE — Telephone Encounter (Signed)
Contacted pt and advised her that [facility] would like for pt to call and scheduled MRI for screening. Pt will call to schedule the appt.

## 2015-09-22 ENCOUNTER — Other Ambulatory Visit: Payer: Self-pay | Admitting: Family Medicine

## 2015-09-22 NOTE — Telephone Encounter (Signed)
Medication refilled per protocol. 

## 2015-09-26 ENCOUNTER — Ambulatory Visit
Admission: RE | Admit: 2015-09-26 | Discharge: 2015-09-26 | Disposition: A | Discharge status: Home / Self Care | Payer: BLUE CROSS/BLUE SHIELD | Source: Ambulatory Visit | Attending: Family Medicine | Admitting: Family Medicine

## 2015-09-27 ENCOUNTER — Other Ambulatory Visit: Payer: Self-pay | Admitting: Family Medicine

## 2015-09-27 ENCOUNTER — Ambulatory Visit
Admission: RE | Admit: 2015-09-27 | Discharge: 2015-09-27 | Disposition: A | Discharge status: Home / Self Care | Payer: BLUE CROSS/BLUE SHIELD | Source: Ambulatory Visit | Attending: Family Medicine | Admitting: Family Medicine

## 2015-09-27 DIAGNOSIS — M5431 Sciatica, right side: Secondary | ICD-10-CM

## 2015-09-28 ENCOUNTER — Other Ambulatory Visit: Payer: Self-pay | Admitting: *Deleted

## 2015-09-28 ENCOUNTER — Telehealth: Payer: Self-pay | Admitting: *Deleted

## 2015-09-28 DIAGNOSIS — M5126 Other intervertebral disc displacement, lumbar region: Secondary | ICD-10-CM

## 2015-09-28 DIAGNOSIS — M48061 Spinal stenosis, lumbar region without neurogenic claudication: Secondary | ICD-10-CM

## 2015-09-28 DIAGNOSIS — M5410 Radiculopathy, site unspecified: Secondary | ICD-10-CM

## 2015-09-28 DIAGNOSIS — M47819 Spondylosis without myelopathy or radiculopathy, site unspecified: Secondary | ICD-10-CM

## 2015-09-28 DIAGNOSIS — M5136 Other intervertebral disc degeneration, lumbar region: Secondary | ICD-10-CM

## 2015-09-28 MED ORDER — OXYCODONE-ACETAMINOPHEN 5-325 MG PO TABS: 1.0000 | ORAL_TABLET | Freq: Four times a day (QID) | ORAL | Status: DC | PRN

## 2015-09-28 NOTE — Telephone Encounter (Signed)
Prescription printed and patient made aware to come to office to pick up on 09/29/2015.

## 2015-09-28 NOTE — Telephone Encounter (Signed)
Received call from patient.   Requested refill on Percocet.   Ok to refill??  Last office visit/ refill 08/13/2015.

## 2015-09-28 NOTE — Telephone Encounter (Signed)
Okay to refill? 

## 2015-10-14 ENCOUNTER — Other Ambulatory Visit: Payer: Self-pay | Admitting: Neurological Surgery

## 2015-10-18 ENCOUNTER — Encounter: Payer: Self-pay | Admitting: Family Medicine

## 2015-10-18 ENCOUNTER — Ambulatory Visit (INDEPENDENT_AMBULATORY_CARE_PROVIDER_SITE_OTHER): Payer: BLUE CROSS/BLUE SHIELD | Admitting: Family Medicine

## 2015-10-18 VITALS — BP 132/80 | HR 76 | Temp 98.0°F | Resp 16 | Ht 62.0 in | Wt 255.0 lb

## 2015-10-18 DIAGNOSIS — M5431 Sciatica, right side: Secondary | ICD-10-CM

## 2015-10-18 MED ORDER — OXYCODONE-ACETAMINOPHEN 7.5-325 MG PO TABS: 1.0000 | ORAL_TABLET | Freq: Three times a day (TID) | ORAL | Status: DC | PRN

## 2015-10-18 MED ORDER — GABAPENTIN 300 MG PO CAPS: 300.0000 mg | ORAL_CAPSULE | Freq: Every day | ORAL | Status: DC

## 2015-10-18 NOTE — Patient Instructions (Signed)
Take gabapentin at bedtime Take percocet three times a day  Change F/U to June

## 2015-10-18 NOTE — Assessment & Plan Note (Addendum)
Disc bulge, DDD, right foraminal stenosis and facet arthritis Will discuss with surgeon Dr. Cyndy Freeze, will cancel surgery she does not want to proceed Given Percocet 7.5mg  TID  Start gabapentin at bedtime

## 2015-10-18 NOTE — Progress Notes (Signed)
Patient ID: Julia Avery, female   DOB: 04-28-1956, 60 y.o.   MRN: [id number]   Subjective:    Patient ID: Julia Avery, female    DOB: [birth year], 60 y.o.   MRN: [id number]  Patient presents for Discuss Surgical Consult  Pt here to discuss MRI and surgery consult. Chronic back pain with right radicular symptoms She was referred to Surgery for possible epidural injections, however surgeon, felt decompression was needed. She states she did complain about a lot of pain, but thought she was going to be given injections. She does not want to go through surgery at this time. Has no family support either.  She does request more pain meds, taking up to 3-4 times a day     Review Of Systems:  GEN- denies fatigue, fever, weight loss,weakness, recent illness HEENT- denies eye drainage, change in vision, nasal discharge, CVS- denies chest pain, palpitations RESP- denies SOB, cough, wheeze ABD- denies N/V, change in stools, abd pain GU- denies dysuria, hematuria, dribbling, incontinence MSK- +joint pain, muscle aches, injury Neuro- denies headache, dizziness, syncope, seizure activity       Objective:    BP 132/80 mmHg  Pulse 76  Temp(Src) 98 F (36.7 C) (Oral)  Resp 16  Ht 5\' 2"  (1.575 m)  Wt 255 lb (115.667 kg)  BMI 46.63 kg/m2 GEN- NAD, alert and oriented x3        Assessment & Plan:      Problem List Items Addressed This Visit    Back pain with right-sided sciatica - Primary    Disc bulge, DDD, right foraminal stenosis and facet arthritis Will discuss with surgeon Dr. Cyndy Freeze, will cancel surgery she does not want to proceed Given Percocet 7.5mg  TID       Relevant Medications   gabapentin (NEURONTIN) 300 MG capsule   oxyCODONE-acetaminophen (PERCOCET) 7.5-325 MG tablet      Note: This dictation was prepared with Dragon dictation along with smaller phrase technology. Any transcriptional errors that result from this process are unintentional.

## 2015-10-20 ENCOUNTER — Ambulatory Visit (HOSPITAL_COMMUNITY): Admit: 2015-10-20 | Payer: BLUE CROSS/BLUE SHIELD | Admitting: Neurological Surgery

## 2015-10-20 ENCOUNTER — Encounter (HOSPITAL_COMMUNITY): Payer: Self-pay

## 2015-10-20 SURGERY — LUMBAR LAMINECTOMY/DECOMPRESSION MICRODISCECTOMY 3 LEVELS
Anesthesia: General | Laterality: Right

## 2015-10-29 ENCOUNTER — Ambulatory Visit: Payer: Self-pay | Admitting: Family Medicine

## 2015-11-16 ENCOUNTER — Telehealth: Payer: Self-pay | Admitting: Family Medicine

## 2015-11-16 MED ORDER — OXYCODONE-ACETAMINOPHEN 7.5-325 MG PO TABS: 1.0000 | ORAL_TABLET | Freq: Three times a day (TID) | ORAL | Status: DC | PRN

## 2015-11-16 NOTE — Telephone Encounter (Signed)
Patient needs rx for her percocet [phone] (M)

## 2015-11-16 NOTE — Telephone Encounter (Signed)
okay

## 2015-11-16 NOTE — Telephone Encounter (Signed)
Prescription printed and patient made aware to come to office to pick up on 11/17/2015.

## 2015-11-16 NOTE — Telephone Encounter (Signed)
Ok to refill??  Last office visit/ refill 10/18/2015.

## 2015-12-15 ENCOUNTER — Telehealth: Payer: Self-pay | Admitting: Family Medicine

## 2015-12-15 MED ORDER — OXYCODONE-ACETAMINOPHEN 7.5-325 MG PO TABS: 1.0000 | ORAL_TABLET | Freq: Three times a day (TID) | ORAL | Status: DC | PRN

## 2015-12-15 NOTE — Telephone Encounter (Signed)
Pt is requesting a refill of Oxycodone 7.5-325 to last her until her appt with Dr. Buelah Manis on 6/5 [phone]

## 2015-12-15 NOTE — Telephone Encounter (Signed)
Prescription printed and patient made aware to come to office to pick up after 4pm on 12/15/2015.

## 2015-12-15 NOTE — Telephone Encounter (Signed)
Ok to refill??  Last office visit 10/18/2015.  Last refill 11/16/2015.

## 2015-12-15 NOTE — Telephone Encounter (Signed)
Okay to refill? 

## 2015-12-20 ENCOUNTER — Ambulatory Visit: Payer: BLUE CROSS/BLUE SHIELD | Admitting: Family Medicine

## 2016-01-03 ENCOUNTER — Telehealth: Payer: Self-pay | Admitting: Family Medicine

## 2016-01-03 ENCOUNTER — Ambulatory Visit: Payer: BLUE CROSS/BLUE SHIELD | Admitting: Family Medicine

## 2016-01-03 MED ORDER — AMLODIPINE BESYLATE 5 MG PO TABS: 5.0000 mg | ORAL_TABLET | Freq: Every day | ORAL | Status: DC

## 2016-01-03 NOTE — Telephone Encounter (Signed)
[facility] [place]  Patient calling to get refill on her amlodipine no refill  [phone]

## 2016-01-03 NOTE — Telephone Encounter (Signed)
Prescription sent to pharmacy.

## 2016-01-12 ENCOUNTER — Telehealth: Payer: Self-pay | Admitting: Family Medicine

## 2016-01-12 MED ORDER — OXYCODONE-ACETAMINOPHEN 7.5-325 MG PO TABS: 1.0000 | ORAL_TABLET | Freq: Three times a day (TID) | ORAL | Status: DC | PRN

## 2016-01-12 NOTE — Telephone Encounter (Signed)
Ok to refill??  Last office visit 11/14/2015.  Last refill 12/15/2015.

## 2016-01-12 NOTE — Telephone Encounter (Signed)
okay

## 2016-01-12 NOTE — Telephone Encounter (Signed)
Prescription printed and patient made aware to come to office to pick up on 01/14/2016.

## 2016-01-12 NOTE — Telephone Encounter (Signed)
Pt is requesting a refill of Oxycodone 7.5-325 [phone]

## 2016-02-10 ENCOUNTER — Telehealth: Payer: Self-pay | Admitting: Family Medicine

## 2016-02-10 NOTE — Telephone Encounter (Signed)
Ok to refill??  Last office visit 10/18/2015.  Last refill 01/12/2016.

## 2016-02-10 NOTE — Telephone Encounter (Signed)
CB# [phone]  oxyCODONE-acetaminophen (PERCOCET) 7.5-325 MG tablet Requesting a new prescription

## 2016-02-11 MED ORDER — OXYCODONE-ACETAMINOPHEN 7.5-325 MG PO TABS: 1.0000 | ORAL_TABLET | Freq: Three times a day (TID) | ORAL | 0 refills | Status: DC | PRN

## 2016-02-11 NOTE — Telephone Encounter (Signed)
Prescription printed and patient made aware to come to office to pick up after 2pm on 02/11/2016.

## 2016-02-11 NOTE — Telephone Encounter (Signed)
Okay to refill? 

## 2016-02-14 ENCOUNTER — Encounter: Payer: Self-pay | Admitting: Family Medicine

## 2016-02-14 ENCOUNTER — Ambulatory Visit (INDEPENDENT_AMBULATORY_CARE_PROVIDER_SITE_OTHER): Payer: BLUE CROSS/BLUE SHIELD | Admitting: Family Medicine

## 2016-02-14 DIAGNOSIS — M4806 Spinal stenosis, lumbar region: Secondary | ICD-10-CM

## 2016-02-14 DIAGNOSIS — M48061 Spinal stenosis, lumbar region without neurogenic claudication: Secondary | ICD-10-CM

## 2016-02-14 DIAGNOSIS — M5431 Sciatica, right side: Secondary | ICD-10-CM | POA: Diagnosis not present

## 2016-02-14 NOTE — Assessment & Plan Note (Signed)
Chronic back pain with some mild spinal stenosis facet arthritis. She is done epidural injections 3 with minimal improvement. Her orthopedic surgeon suggested time off in between injections to help with healing however she is working too many hours. I think this uses, need surgical intervention decompression laminectomy was scheduled a few months back. I recommend that she get FMLA form from her work. I will give her the rest the week off to rest that she is up on her feet too much which is aggravating her spine. She will contact her orthopedic surgeon with regards to the surgery. I refilled her pain medication last week. No new neurological signs

## 2016-02-14 NOTE — Patient Instructions (Signed)
F/U as needed Continue pain medication
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
## 2016-03-09 ENCOUNTER — Telehealth: Payer: Self-pay | Admitting: Family Medicine

## 2016-03-09 NOTE — Telephone Encounter (Signed)
Patient is calling to get rx for percocet  [phone]

## 2016-03-09 NOTE — Telephone Encounter (Signed)
Ok to refill??  Last office visit 02/14/2016.  Last refill 02/11/2016.

## 2016-03-10 MED ORDER — OXYCODONE-ACETAMINOPHEN 7.5-325 MG PO TABS: 1.0000 | ORAL_TABLET | Freq: Three times a day (TID) | ORAL | 0 refills | Status: DC | PRN

## 2016-03-10 NOTE — Telephone Encounter (Signed)
Prescription printed and patient made aware to come to office to pick up after 2 pm on 03/10/2016. 

## 2016-03-10 NOTE — Telephone Encounter (Signed)
Okay to refill? 

## 2016-04-10 ENCOUNTER — Telehealth: Payer: Self-pay | Admitting: Family Medicine

## 2016-04-10 MED ORDER — OXYCODONE-ACETAMINOPHEN 7.5-325 MG PO TABS: 1.0000 | ORAL_TABLET | Freq: Three times a day (TID) | ORAL | 0 refills | Status: DC | PRN

## 2016-04-10 NOTE — Telephone Encounter (Signed)
Prescription printed and patient made aware to come to office to pick up on 04/11/2016.

## 2016-04-10 NOTE — Telephone Encounter (Signed)
Okay to refill? 

## 2016-04-10 NOTE — Telephone Encounter (Signed)
Ok to refill??  Last office visit 02/14/2016.  Last refill 03/10/2016.

## 2016-04-10 NOTE — Telephone Encounter (Signed)
Patient called in requesting a prescription refill on her percocet  CB# [phone]

## 2016-04-15 ENCOUNTER — Other Ambulatory Visit: Payer: Self-pay | Admitting: Family Medicine

## 2016-04-19 ENCOUNTER — Other Ambulatory Visit: Payer: Self-pay | Admitting: *Deleted

## 2016-04-19 MED ORDER — AMLODIPINE BESYLATE 5 MG PO TABS: 5.0000 mg | ORAL_TABLET | Freq: Every day | ORAL | 2 refills | Status: DC

## 2016-04-19 NOTE — Telephone Encounter (Signed)
Received fax requesting refill on Amlodipine.   Refill appropriate and filled per protocol. 

## 2016-05-01 ENCOUNTER — Encounter: Payer: Self-pay | Admitting: *Deleted

## 2016-05-01 ENCOUNTER — Telehealth: Payer: Self-pay | Admitting: *Deleted

## 2016-05-01 MED ORDER — INDOMETHACIN 50 MG PO CAPS: 50.0000 mg | ORAL_CAPSULE | Freq: Three times a day (TID) | ORAL | 0 refills | Status: DC | PRN

## 2016-05-01 NOTE — Telephone Encounter (Signed)
Received call from patient.   Reports that she was seen in [facility] ER and received new dx of gout. States that she was given Indocin for pain.   Requested refill on Indocin.   Requested records from [place]. Noted patient seen on 03/10/2016. Rx given for Indocin 50mg  (1) cap PO TID PRN~ pain/ gout~ #30.  MD please advise.

## 2016-05-01 NOTE — Telephone Encounter (Signed)
Okay to give 1 refill, for any further needs OV

## 2016-05-01 NOTE — Telephone Encounter (Signed)
Prescription sent to pharmacy.

## 2016-05-10 ENCOUNTER — Telehealth: Payer: Self-pay | Admitting: *Deleted

## 2016-05-10 MED ORDER — OXYCODONE-ACETAMINOPHEN 7.5-325 MG PO TABS: 1.0000 | ORAL_TABLET | Freq: Three times a day (TID) | ORAL | 0 refills | Status: DC | PRN

## 2016-05-10 NOTE — Telephone Encounter (Signed)
Received call from patient.   Requested refill on Percocet.   Ok to refill??  Last office visit 02/14/2016.  Last refill 04/10/2016.

## 2016-05-10 NOTE — Telephone Encounter (Signed)
Prescription printed and patient made aware to come to office to pick up on 05/11/2016.

## 2016-05-10 NOTE — Telephone Encounter (Signed)
okay

## 2016-06-05 ENCOUNTER — Ambulatory Visit (INDEPENDENT_AMBULATORY_CARE_PROVIDER_SITE_OTHER): Payer: BLUE CROSS/BLUE SHIELD | Admitting: Physician Assistant

## 2016-06-05 ENCOUNTER — Encounter: Payer: Self-pay | Admitting: Physician Assistant

## 2016-06-05 VITALS — BP 138/88 | HR 79 | Temp 98.1°F | Resp 16 | Wt 245.0 lb

## 2016-06-05 DIAGNOSIS — M48061 Spinal stenosis, lumbar region without neurogenic claudication: Secondary | ICD-10-CM | POA: Diagnosis not present

## 2016-06-05 MED ORDER — OXYCODONE-ACETAMINOPHEN 10-325 MG PO TABS: 1.0000 | ORAL_TABLET | Freq: Three times a day (TID) | ORAL | 0 refills | Status: DC | PRN

## 2016-06-05 NOTE — Progress Notes (Signed)
Patient ID: Julia Avery MRN: [id number], DOB: [birth year], 60 y.o. Date of Encounter: 06/05/2016, 3:15 PM    Chief Complaint:  Chief Complaint  Patient presents with  . Back Pain  . Leg Pain     HPI: 60 y.o. year old AA female presents with above.   Reviewed her MRI lumbar spine performed 09/27/15. See that report for full details. It did show significant abnormalities. Dr. Buelah Manis reviewed the report she recommended patient see a neurosurgeon as the patient had disc bulge, mild spinal stenosis, arthritis.  Also reviewed Dr. Dorian Heckle office note dated 02/14/16. See that note for details. Reviewed that that note includes the following information: "It was recommended that she have surgical intervention. However decision was made to try epidural injections first. Her third epidural injection which was about 3 weeks ago and still no significant improvement in her back pain. Dr. Dorian Heckle assessment plan at that time included the following:" she has done epidural injection 3 with minimal improvement. Her orthopedic surgeon suggested time off in between injections to help with healing. Dr. Buelah Manis gave her some time off work for the rest of that week so that she can rest the back. She was to follow-up with her orthopedic surgeon regarding surgery. Noted that she had refilled her pain medicine the week prior.   TODAY pt states that she has seen Dr. Cyndy Freeze. She states that they mailed her information about a new procedure that they could do. She states that she "can't be on her back for 6-8 weeks after the surgery". Also says that everyone she has talked to that had back surgery is worse off after the surgery than they were prior to the surgery. At this point in time she is stating that she has no intentions of having surgery on her back. Regarding the set of 3 injections--she says that after the first injection she noticed no change. After the second injection she noticed some relief. Says  that when she had her third injection she "thought she was going to be paralyzed" says that it was painful and it really scared her.  Says that "last week was a rough week" --says that she was having to take 2 of the oxycodone 7.5 mg tablets to get relief. Says when she would take one she did not feel any relief. She would take 2 in the morning but then would not take anymore later in the day because she was afraid to take more. Says that this week has not been so bad so far. Says that she is almost out of her pain pills so she needed to come in and also she wanted Dr. Buelah Manis to be aware that one of the 7.5 mg was not controlling her pain when she was having severe pain. She says that a lot of days she does not have pain at all and doesn't need any kind of pain medicine whatsoever. However when she is having a flare then taking one 7.5 oxycodone doesn't give her relief. Says that for example she used none yesterday.     Home Meds:   Outpatient Medications Prior to Visit  Medication Sig Dispense Refill  . amLODipine (NORVASC) 5 MG tablet Take 1 tablet (5 mg total) by mouth daily. 90 tablet 2  . oxyCODONE-acetaminophen (PERCOCET) 7.5-325 MG tablet Take 1 tablet by mouth 3 (three) times daily as needed for severe pain. 90 tablet 0  . indomethacin (INDOCIN) 50 MG capsule Take 1 capsule (50 mg total) by mouth 3 (  three) times daily as needed. (Patient not taking: Reported on 06/05/2016) 30 capsule 0   No facility-administered medications prior to visit.     Allergies:  Allergies  Allergen Reactions  . Penicillins     Childhood reaction, never taken      Review of Systems: See HPI for pertinent ROS. All other ROS negative.    Physical Exam: Blood pressure 138/88, pulse 79, temperature 98.1 F (36.7 C), temperature source Oral, resp. rate 16, weight 245 lb (111.1 kg), SpO2 99 %., Body mass index is 44.81 kg/m. General: Obese AAF.  Appears in no acute distress. Neck: Supple. No thyromegaly.  No lymphadenopathy. Lungs: Clear bilaterally to auscultation without wheezes, rales, or rhonchi. Breathing is unlabored. Heart: Regular rhythm. No murmurs, rubs, or gallops. Msk:  Strength and tone normal for age. Extremities/Skin: Warm and dry.  Neuro: Alert and oriented X 3. Moves all extremities spontaneously. Gait is normal. CNII-XII grossly in tact. Psych:  Responds to questions appropriately with a normal affect.     ASSESSMENT AND PLAN:  60 y.o. year old female with  1. Spinal stenosis of lumbar region, unspecified whether neurogenic claudication present I discussed with patient whether she needs a low dose/milder pain medicine to have to use at times and then a stronger higher dose medicine to use only as needed when she has severe pain.  She states that she has very minimal pain at times---like right now she says that she really is not feeling any back pain at present.  Says that at times like this she would not take any type of medicine.  However says that when the pain flares then it is not controlled with oxycodone 7.5 mg.  Therefore I told her that I will give her some oxycodone 10 mg to use when she has severe pain.  However discussed that she absolutely cannot take 2 of these together as she could overdose.  Discussed with patient that this is the maximum dose of this medicine and absolutely not to take more than 1 per 8 hours.  Explained that this medication can slow respirations and can cause death.  She voices understanding and agrees.  As well I recommend that she schedule follow-up visit with Dr. Buelah Manis in one month.  Reviewed her last Rx was on 05/10/16 for #90 so at this time I am printing this prescription for #90. - oxyCODONE-acetaminophen (PERCOCET) 10-325 MG tablet; Take 1 tablet by mouth every 8 (eight) hours as needed for pain.  Dispense: 90 tablet; Refill: 0  Drug registry was printed and reviewed. On 03/10/16 she got hydrocodone 5/325 #10 prescribed by Dr.  Jake Samples. Otherwise all other medications on drug registry are prescribed by Dr. Lynnea Maizes are for oxycodone 7.5/325. 12/16/15 she got #90. On 02/11/16 she got #90. 05/11/16 she got #90. This is all that is on her drug registry.  Marin Olp [place], [place], [facility] 06/05/2016 3:15 PM

## 2016-07-03 ENCOUNTER — Telehealth: Payer: Self-pay | Admitting: *Deleted

## 2016-07-03 DIAGNOSIS — M48061 Spinal stenosis, lumbar region without neurogenic claudication: Secondary | ICD-10-CM

## 2016-07-03 MED ORDER — OXYCODONE-ACETAMINOPHEN 10-325 MG PO TABS: 1.0000 | ORAL_TABLET | Freq: Three times a day (TID) | ORAL | 0 refills | Status: DC | PRN

## 2016-07-03 NOTE — Telephone Encounter (Signed)
Received call from patient.   Requested refill on Percocet 10/325mg .  Ok to refill??  Last office visit/ refill 06/05/2016.

## 2016-07-03 NOTE — Telephone Encounter (Signed)
Prescription printed and patient made aware to come to office to pick up after 2pm on 07/03/2016.

## 2016-07-03 NOTE — Telephone Encounter (Signed)
okay

## 2016-08-04 ENCOUNTER — Telehealth: Payer: Self-pay | Admitting: Family Medicine

## 2016-08-04 DIAGNOSIS — M48061 Spinal stenosis, lumbar region without neurogenic claudication: Secondary | ICD-10-CM

## 2016-08-04 MED ORDER — OXYCODONE-ACETAMINOPHEN 10-325 MG PO TABS: 1.0000 | ORAL_TABLET | Freq: Three times a day (TID) | ORAL | 0 refills | Status: DC | PRN

## 2016-08-04 NOTE — Telephone Encounter (Signed)
Prescription printed and patient made aware to come to office to pick up after 4pm on 08/04/2016.

## 2016-08-04 NOTE — Telephone Encounter (Signed)
okay

## 2016-08-04 NOTE — Telephone Encounter (Signed)
Ok to refill??  Last office visit 06/05/2016.  Last refill 07/03/2016. 

## 2016-08-04 NOTE — Telephone Encounter (Signed)
cb [phone] pt needs refill on oxycodone.

## 2016-08-21 ENCOUNTER — Encounter: Payer: Self-pay | Admitting: Family Medicine

## 2016-08-21 ENCOUNTER — Ambulatory Visit (INDEPENDENT_AMBULATORY_CARE_PROVIDER_SITE_OTHER): Payer: BLUE CROSS/BLUE SHIELD | Admitting: Family Medicine

## 2016-08-21 VITALS — BP 148/86 | HR 80 | Temp 98.1°F | Resp 16 | Ht 62.0 in | Wt 253.0 lb

## 2016-08-21 DIAGNOSIS — M48061 Spinal stenosis, lumbar region without neurogenic claudication: Secondary | ICD-10-CM | POA: Diagnosis not present

## 2016-08-21 DIAGNOSIS — I10 Essential (primary) hypertension: Secondary | ICD-10-CM

## 2016-08-21 DIAGNOSIS — M5136 Other intervertebral disc degeneration, lumbar region: Secondary | ICD-10-CM | POA: Diagnosis not present

## 2016-08-21 LAB — CBC WITH DIFFERENTIAL/PLATELET
Basophils Absolute: 86 cells/uL (ref 0–200)
Basophils Relative: 1 %
Eosinophils Absolute: 86 cells/uL (ref 15–500)
Eosinophils Relative: 1 %
HCT: 38.8 % (ref 35.0–45.0)
Hemoglobin: 12.6 g/dL (ref 12.0–15.0)
Lymphocytes Relative: 51 %
Lymphs Abs: 4386 cells/uL — ABNORMAL HIGH (ref 850–3900)
MCH: 29.4 pg (ref 27.0–33.0)
MCHC: 32.5 g/dL (ref 32.0–36.0)
MCV: 90.4 fL (ref 80.0–100.0)
MPV: 9.3 fL (ref 7.5–12.5)
Monocytes Absolute: 516 cells/uL (ref 200–950)
Monocytes Relative: 6 %
Neutro Abs: 3526 cells/uL (ref 1500–7800)
Neutrophils Relative %: 41 %
Platelets: 411 10*3/uL — ABNORMAL HIGH (ref 140–400)
RBC: 4.29 MIL/uL (ref 3.80–5.10)
RDW: 15.6 % — ABNORMAL HIGH (ref 11.0–15.0)
WBC: 8.6 10*3/uL (ref 3.8–10.8)

## 2016-08-21 LAB — LIPID PANEL
Cholesterol: 223 mg/dL — ABNORMAL HIGH (ref <200)
HDL: 105 mg/dL (ref >50)
LDL Cholesterol: 105 mg/dL — ABNORMAL HIGH (ref <100)
Total CHOL/HDL Ratio: 2.1 Ratio (ref <5.0)
Triglycerides: 65 mg/dL (ref <150)
VLDL: 13 mg/dL (ref <30)

## 2016-08-21 LAB — COMPREHENSIVE METABOLIC PANEL
ALT: 18 U/L (ref 6–29)
AST: 14 U/L (ref 10–35)
Albumin: 4.2 g/dL (ref 3.6–5.1)
Alkaline Phosphatase: 69 U/L (ref 33–130)
BUN: 10 mg/dL (ref 7–25)
CO2: 29 mmol/L (ref 20–31)
Calcium: 9.3 mg/dL (ref 8.6–10.4)
Chloride: 106 mmol/L (ref 98–110)
Creat: 0.73 mg/dL (ref 0.50–1.05)
Glucose, Bld: 102 mg/dL — ABNORMAL HIGH (ref 70–99)
Potassium: 3.7 mmol/L (ref 3.5–5.3)
Sodium: 143 mmol/L (ref 135–146)
Total Bilirubin: 0.3 mg/dL (ref 0.2–1.2)
Total Protein: 7 g/dL (ref 6.1–8.1)

## 2016-08-21 LAB — TSH: TSH: 1.14 mIU/L

## 2016-08-21 MED ORDER — OXYCODONE-ACETAMINOPHEN 10-325 MG PO TABS: 1.0000 | ORAL_TABLET | Freq: Three times a day (TID) | ORAL | 0 refills | Status: DC | PRN

## 2016-08-21 MED ORDER — MELOXICAM 7.5 MG PO TABS: 7.5000 mg | ORAL_TABLET | Freq: Every day | ORAL | 6 refills | Status: DC

## 2016-08-21 MED ORDER — AMLODIPINE BESYLATE 10 MG PO TABS: 10.0000 mg | ORAL_TABLET | Freq: Every day | ORAL | 2 refills | Status: DC

## 2016-08-21 NOTE — Assessment & Plan Note (Signed)
Blood pressure is uncontrolled increase amlodipine to 10 mg once a day discussed dietary medications of her obesity on her heart.

## 2016-08-21 NOTE — Patient Instructions (Addendum)
[facility]  We will call with lab results  F/u 2 MONTHS

## 2016-08-21 NOTE — Progress Notes (Signed)
Subjective:    Patient ID: Julia Avery, female    DOB: [birth year], 61 y.o.   MRN: [id number]  Patient presents for Lumbar Back Pain (reports that she is having increased back pain and would like to try PT)  Patient here with increased back pain. States the past couple months her pain has been worse or she notes that she needs surgical intervention but she is trying to get resources in place that she can go out for the period of time needed. She has not seen Dr. Cyndy Freeze since earlier last year. She's been maintained on Percocet but states at times she will take more than the 3 allowed in a day. Asked if  there was something she could take for the arthritis component. In the past she has been on diclofenac, Naprosyn and meloxicam. She states that a friend of hers told her she should be on steroids therefore she had been taking him of her friend's prednisone. Asked for PT  HTN- taking norvasc as prescribed, has gained weight, admits that she has not been following a proper diet and is at little activity.  Review Of Systems:  GEN- denies fatigue, fever, weight loss,weakness, recent illness HEENT- denies eye drainage, change in vision, nasal discharge, CVS- denies chest pain, palpitations RESP- denies SOB, cough, wheeze ABD- denies N/V, change in stools, abd pain GU- denies dysuria, hematuria, dribbling, incontinence MSK- + joint pain, muscle aches, injury Neuro- denies headache, dizziness, syncope, seizure activity       Objective:    BP (!) 148/86   Pulse 80   Temp 98.1 F (36.7 C) (Oral)   Resp 16   Ht 5\' 2"  (1.575 m)   Wt 253 lb (114.8 kg)   SpO2 98%   BMI 46.27 kg/m  GEN- NAD, alert and oriented x3 HEENT- PERRL, EOMI, non injected sclera, pink conjunctiva, MMM, oropharynx clear CVS- RRR, no murmur RESP-CTAB MSK- Lumbar spine TTP Right paraspinals, Fair ROM, +SLR EXT- No edema Pulses- Radial, DP- 2+        Assessment & Plan:      Problem List Items  Addressed This Visit    Spinal stenosis of lumbar region   Relevant Medications   oxyCODONE-acetaminophen (PERCOCET) 10-325 MG tablet   Other Relevant Orders   Ambulatory referral to Physical Therapy   Morbid obesity ([place]) - Primary   Relevant Orders   Lipid panel   Ambulatory referral to Physical Therapy   Essential hypertension, benign    Blood pressure is uncontrolled increase amlodipine to 10 mg once a day discussed dietary medications of her obesity on her heart.      Relevant Medications   amLODipine (NORVASC) 10 MG tablet   Other Relevant Orders   CBC with Differential/Platelet   Comprehensive metabolic panel   Lipid panel   TSH   DDD (degenerative disc disease), lumbar    She does have significant pathology in her lumbar spine with stenosis and degenerative changes. She would like to do physical therapy not sure if this can be helpful this far out but this will buy her some time until she is able to have surgery. Discussed the importance of taking her pain medication as prescribed she is getting 90 tablets per month. I will add that meloxicam once a day. I did offer to send her to a pain clinic but she declines this.      Relevant Medications   meloxicam (MOBIC) 7.5 MG tablet   oxyCODONE-acetaminophen (PERCOCET) 10-325 MG tablet  Other Relevant Orders   Ambulatory referral to Physical Therapy      Note: This dictation was prepared with Dragon dictation along with smaller phrase technology. Any transcriptional errors that result from this process are unintentional.

## 2016-08-21 NOTE — Assessment & Plan Note (Signed)
She does have significant pathology in her lumbar spine with stenosis and degenerative changes. She would like to do physical therapy not sure if this can be helpful this far out but this will buy her some time until she is able to have surgery. Discussed the importance of taking her pain medication as prescribed she is getting 90 tablets per month. I will add that meloxicam once a day. I did offer to send her to a pain clinic but she declines this.

## 2016-08-28 ENCOUNTER — Encounter: Payer: Self-pay | Admitting: *Deleted

## 2016-08-28 ENCOUNTER — Ambulatory Visit (HOSPITAL_COMMUNITY): Payer: BLUE CROSS/BLUE SHIELD | Admitting: Physical Therapy

## 2016-09-05 ENCOUNTER — Ambulatory Visit (HOSPITAL_COMMUNITY): Payer: BLUE CROSS/BLUE SHIELD | Attending: Family Medicine | Admitting: Physical Therapy

## 2016-09-26 ENCOUNTER — Telehealth: Payer: Self-pay | Admitting: *Deleted

## 2016-09-26 DIAGNOSIS — M48061 Spinal stenosis, lumbar region without neurogenic claudication: Secondary | ICD-10-CM

## 2016-09-26 NOTE — Telephone Encounter (Signed)
Received call from patient.   Requested refill on Percocet.   Ok to refill??  Last office visit/ refill 08/21/2016.

## 2016-09-27 MED ORDER — OXYCODONE-ACETAMINOPHEN 10-325 MG PO TABS: 1.0000 | ORAL_TABLET | Freq: Three times a day (TID) | ORAL | 0 refills | Status: DC | PRN

## 2016-09-27 NOTE — Telephone Encounter (Signed)
Prescription printed and patient made aware to come to office to pick up after 2 pm on 09/27/2016. 

## 2016-09-27 NOTE — Telephone Encounter (Signed)
okay

## 2016-10-24 ENCOUNTER — Encounter: Payer: Self-pay | Admitting: Family Medicine

## 2016-10-24 ENCOUNTER — Ambulatory Visit (INDEPENDENT_AMBULATORY_CARE_PROVIDER_SITE_OTHER): Payer: BLUE CROSS/BLUE SHIELD | Admitting: Family Medicine

## 2016-10-24 VITALS — BP 136/72 | HR 84 | Temp 98.4°F | Resp 16 | Ht 62.0 in | Wt 254.0 lb

## 2016-10-24 DIAGNOSIS — I10 Essential (primary) hypertension: Secondary | ICD-10-CM | POA: Diagnosis not present

## 2016-10-24 DIAGNOSIS — M48061 Spinal stenosis, lumbar region without neurogenic claudication: Secondary | ICD-10-CM

## 2016-10-24 MED ORDER — OXYCODONE-ACETAMINOPHEN 10-325 MG PO TABS: 1.0000 | ORAL_TABLET | Freq: Three times a day (TID) | ORAL | 0 refills | Status: DC | PRN

## 2016-10-24 NOTE — Assessment & Plan Note (Signed)
Blood pressure is much improved today continue amlodipine 10 mg continue to reiterate importance of dietary changes and weight loss which also help with her chronic back pain

## 2016-10-24 NOTE — Patient Instructions (Signed)
F/U Monday April 23

## 2016-10-24 NOTE — Progress Notes (Signed)
   Subjective:    Patient ID: Julia Avery, female    DOB: 1956/05/20, 61 y.o.   MRN: [id number]  Patient presents for Follow-up (is fasting)  HTN- at least visit 2 months ago, norvasc increased to 10mg ,labs reviewed    Chronic back pain- on percocet, also added meloxicam as prescribed, working 60-70 hours, Has been trying to cut back on pain medication  She has known spinal stenosis with radicular symptoms. She does not want to have cervical intervention. 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 She would like to take a couple weeks off to help with her back and to attend physical therapy   Review Of Systems:  GEN- denies fatigue, fever, weight loss,weakness, recent illness HEENT- denies eye drainage, change in vision, nasal discharge, CVS- denies chest pain, palpitations RESP- denies SOB, cough, wheeze ABD- denies N/V, change in stools, abd pain GU- denies dysuria, hematuria, dribbling, incontinence MSK- +joint pain, muscle aches, injury Neuro- denies headache, dizziness, syncope, seizure activity       Objective:    BP 136/72   Pulse 84   Temp 98.4 F (36.9 C) (Oral)   Resp 16   Ht 5\' 2"  (1.575 m)   Wt 254 lb (115.2 kg)   SpO2 99%   BMI 46.46 kg/m  GEN- NAD, alert and oriented x3 HEENT- PERRL, EOMI, non injected sclera, pink conjunctiva, MMM, oropharynx clear Neck- Supple, no thyromegaly CVS- RRR, no murmur RESP-CTAB MSK- TTP Lumbar spine, right paraspinal, decreased ROM, +SLR Neuro- normal tone, sensation grossly  Intact, antlagic gait, strength decreased 2/2 pain Right compared to left         Assessment & Plan:      Problem List Items Addressed This Visit    Spinal stenosis of lumbar region    To be taken out of work for the next 2 weeks. We'll follow-up on the 23rd. She will call to have her physical therapy appointment scheduled. She will continue her current  pain regimen. At this time she does not want surgical intervention      Relevant Medications   oxyCODONE-acetaminophen (PERCOCET) 10-325 MG tablet   Essential hypertension, benign - Primary    Blood pressure is much improved today continue amlodipine 10 mg continue to reiterate importance of dietary changes and weight loss which also help with her chronic back pain         Note: This dictation was prepared with Dragon dictation along with smaller phrase technology. Any transcriptional errors that result from this process are unintentional.

## 2016-10-24 NOTE — Assessment & Plan Note (Signed)
To be taken out of work for the next 2 weeks. We'll follow-up on the 23rd. She will call to have her physical therapy appointment scheduled. She will continue her current pain regimen. At this time she does not want surgical intervention

## 2016-10-25 ENCOUNTER — Encounter: Payer: Self-pay | Admitting: Family Medicine

## 2016-10-31 ENCOUNTER — Ambulatory Visit (HOSPITAL_COMMUNITY): Payer: BLUE CROSS/BLUE SHIELD | Attending: Family Medicine

## 2016-10-31 ENCOUNTER — Encounter (HOSPITAL_COMMUNITY): Payer: Self-pay

## 2016-10-31 DIAGNOSIS — G8929 Other chronic pain: Secondary | ICD-10-CM | POA: Diagnosis present

## 2016-10-31 DIAGNOSIS — R262 Difficulty in walking, not elsewhere classified: Secondary | ICD-10-CM | POA: Diagnosis present

## 2016-10-31 DIAGNOSIS — R29898 Other symptoms and signs involving the musculoskeletal system: Secondary | ICD-10-CM

## 2016-10-31 DIAGNOSIS — M6281 Muscle weakness (generalized): Secondary | ICD-10-CM | POA: Insufficient documentation

## 2016-10-31 DIAGNOSIS — M5441 Lumbago with sciatica, right side: Secondary | ICD-10-CM | POA: Diagnosis present

## 2016-10-31 NOTE — Patient Instructions (Signed)
  Walking Program  Walking is a great way to improve cardiovascular endurance and improve leg strength.  Start by walking 5-10 minutes and progress yourself until you can walk continuously for 30-45 minutes straight.  It is a good habit to take a walk daily or at the very least 3-5 times per week.    BRIDGING  While lying on your back, tighten your lower abdominals, squeeze your buttocks and then raise your buttocks off the floor/bed as creating a "Bridge" with your body. Hold and then lower yourself and repeat.  Perform 2-3 sets of 10 reps each, 1x/day
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
## 2016-11-01 ENCOUNTER — Telehealth (HOSPITAL_COMMUNITY): Payer: Self-pay | Admitting: Family Medicine

## 2016-11-01 NOTE — Telephone Encounter (Signed)
11/01/16 I called to see if patient could move to 4:45 on 4/19 since therapist was going to be out sick.  She said that she couldn't do that and there is nowhere to move on 4/20 so I told her that we could add to the end of her schedule and she said ok.

## 2016-11-02 ENCOUNTER — Ambulatory Visit (HOSPITAL_COMMUNITY): Payer: BLUE CROSS/BLUE SHIELD | Admitting: Physical Therapy

## 2016-11-06 ENCOUNTER — Ambulatory Visit (INDEPENDENT_AMBULATORY_CARE_PROVIDER_SITE_OTHER): Payer: BLUE CROSS/BLUE SHIELD | Admitting: Family Medicine

## 2016-11-06 ENCOUNTER — Encounter: Payer: Self-pay | Admitting: Family Medicine

## 2016-11-06 VITALS — BP 138/70 | HR 82 | Temp 98.1°F | Resp 14 | Ht 62.0 in | Wt 256.0 lb

## 2016-11-06 DIAGNOSIS — G8929 Other chronic pain: Secondary | ICD-10-CM

## 2016-11-06 DIAGNOSIS — M5136 Other intervertebral disc degeneration, lumbar region: Secondary | ICD-10-CM

## 2016-11-06 DIAGNOSIS — M5441 Lumbago with sciatica, right side: Secondary | ICD-10-CM | POA: Diagnosis not present

## 2016-11-06 DIAGNOSIS — M48061 Spinal stenosis, lumbar region without neurogenic claudication: Secondary | ICD-10-CM

## 2016-11-06 MED ORDER — OXYCODONE-ACETAMINOPHEN 10-325 MG PO TABS: 1.0000 | ORAL_TABLET | Freq: Three times a day (TID) | ORAL | 0 refills | Status: DC | PRN

## 2016-11-06 NOTE — Progress Notes (Signed)
   Subjective:    Patient ID: Julia Avery, female    DOB: 02-01-1956, 61 y.o.   MRN: [id number]  Patient presents for Follow-up (is not fasting)  She had for follow-up on her chronic back pain. She was taken out of work for 2 weeks secondary to increasing pain. She was continued on her pain medication she was also supposed to call about her physical therapy. She has known spinal stenosis and some degenerative changes in her back she does not want surgical intervention. She has had 1 therapy session they had to cancel her one for April 19. She is in therapy 2 times a week for 30 days. She is supposed to and on May 15 as they're giving her an extra date for the one they canceled. Her pain has improved with the rest on her back but she is ready to return to work. She does have some forms for me to complete for her disability insurance.  Review Of Systems:  GEN- denies fatigue, fever, weight loss,weakness, recent illness HEENT- denies eye drainage, change in vision, nasal discharge, CVS- denies chest pain, palpitations RESP- denies SOB, cough, wheeze ABD- denies N/V, change in stools, abd pain GU- denies dysuria, hematuria, dribbling, incontinence MSK- denies joint pain, muscle aches, injury Neuro- denies headache, dizziness, syncope, seizure activity       Objective:    BP 138/70   Pulse 82   Temp 98.1 F (36.7 C) (Oral)   Resp 14   Ht 5\' 2"  (1.575 m)   Wt 256 lb (116.1 kg)   SpO2 98%   BMI 46.82 kg/m  GEN- NAD, alert and oriented x3        Assessment & Plan:    Approx 15 minutes spent with pt  approx  50% of time completing forms and letters   Problem List Items Addressed This Visit    Spinal stenosis of lumbar region - Primary    Chronic back pain with spinal stenosis and degenerative disc disease. At this time she'll continue with her meloxicam anti-inflammatory her Percocet as needed for pain up to her prescription for May. She will continue with her physical  therapy sessions 2 times a week and we will reevaluate her at the end of her sessions in about a month. I completed the forms that she required today and she was also given a note to return to work.      Relevant Medications   oxyCODONE-acetaminophen (PERCOCET) 10-325 MG tablet   DDD (degenerative disc disease), lumbar   Relevant Medications   oxyCODONE-acetaminophen (PERCOCET) 10-325 MG tablet   Chronic back pain   Relevant Medications   oxyCODONE-acetaminophen (PERCOCET) 10-325 MG tablet      Note: This dictation was prepared with Dragon dictation along with smaller phrase technology. Any transcriptional errors that result from this process are unintentional.

## 2016-11-06 NOTE — Assessment & Plan Note (Signed)
Chronic back pain with spinal stenosis and degenerative disc disease. At this time she'll continue with her meloxicam anti-inflammatory her Percocet as needed for pain up to her prescription for May. She will continue with her physical therapy sessions 2 times a week and we will reevaluate her at the end of her sessions in about a month. I completed the forms that she required today and she was also given a note to return to work.

## 2016-11-06 NOTE — Patient Instructions (Addendum)
F/U 1 month for Physical/PAP

## 2016-11-07 ENCOUNTER — Telehealth (HOSPITAL_COMMUNITY): Payer: Self-pay | Admitting: Family Medicine

## 2016-11-07 ENCOUNTER — Ambulatory Visit (HOSPITAL_COMMUNITY): Payer: BLUE CROSS/BLUE SHIELD

## 2016-11-07 NOTE — Telephone Encounter (Signed)
11/07/16 pt called to cx but no reason was given

## 2016-11-09 ENCOUNTER — Telehealth (HOSPITAL_COMMUNITY): Payer: Self-pay

## 2016-11-09 ENCOUNTER — Ambulatory Visit (HOSPITAL_COMMUNITY): Payer: BLUE CROSS/BLUE SHIELD

## 2016-11-09 NOTE — Telephone Encounter (Signed)
No show #1: Left pt a message regarding missed appointment. I told her that I had an opening at 1PM today and that if she was able to, she could come in for her appointment then. Asked pt to call the office if she planned on coming at 1:00, otherwise, I reminded her of her next scheduled appointment on 11/14/16 at 9:00.  Geraldine Solar PT, DPT

## 2016-11-14 ENCOUNTER — Telehealth (HOSPITAL_COMMUNITY): Payer: Self-pay | Admitting: Physical Therapy

## 2016-11-14 ENCOUNTER — Ambulatory Visit (HOSPITAL_COMMUNITY): Payer: BLUE CROSS/BLUE SHIELD | Attending: Family Medicine | Admitting: Physical Therapy

## 2016-11-14 NOTE — Telephone Encounter (Signed)
Pt no show #2. Called and left voicemail regarding missed appointment and discussed office attendance policy regarding no shows and cancellations. Reminded pt of upcoming appointment and provided office # for her to call with any questions/concerns.   9:46 AM,11/14/16 Elly Modena PT, [facility] [phone]

## 2016-11-16 ENCOUNTER — Telehealth (HOSPITAL_COMMUNITY): Payer: Self-pay

## 2016-11-16 ENCOUNTER — Encounter (HOSPITAL_COMMUNITY): Payer: Self-pay

## 2016-11-16 ENCOUNTER — Ambulatory Visit (HOSPITAL_COMMUNITY): Payer: BLUE CROSS/BLUE SHIELD

## 2016-11-16 NOTE — Therapy (Signed)
[facility] [place], [place], [address] Phone: [phone]   Fax:  [phone]  Patient Details  Name: Julia Avery MRN: [id number] Date of Birth: [birth year] Referring Provider:  No ref. provider found  Encounter Date: 11/16/2016  PHYSICAL THERAPY DISCHARGE SUMMARY  Visits from Start of Care: 1  Current functional level related to goals / functional outcomes: Pt only attended her initial evaluation and did not attend any of her follow-up visits.   Remaining deficits: n/a as pt did not attend any follow-up sessions.  Education / Equipment: Can return to therapy with referral if she wishes to continue.   Plan: Patient agrees to discharge.  Patient goals were not met. Patient is being discharged due to not returning since the last visit.  ?????       Geraldine Solar PT, [facility] [address] [place], [place], [address] Phone: [phone]   Fax:  [phone]

## 2016-11-16 NOTE — Telephone Encounter (Signed)
No show #3: left message regarding missed appointment. Informed pt that it was her 3rd no show and per [place], she will be automatically discharged. Educated pt that if she wishes to continue her therapy, she can return with a referral. Left [facility] office number for her to call back if she had any questions.  Geraldine Solar PT, DPT

## 2016-11-20 ENCOUNTER — Encounter: Payer: Self-pay | Admitting: Family Medicine

## 2016-11-21 ENCOUNTER — Ambulatory Visit (HOSPITAL_COMMUNITY): Payer: BLUE CROSS/BLUE SHIELD

## 2016-11-22 ENCOUNTER — Telehealth: Payer: Self-pay | Admitting: *Deleted

## 2016-11-22 NOTE — Telephone Encounter (Signed)
Patient in office and dropped off disability forms.   Reason disability requested: spinal stenosis, chronic back pain  Verbalized that fee may be charged and is per provider prerogative.   Forms routed to provider.    Provider competed disability forms.   No charge per provider.   Faxed to patient at [phone].

## 2016-11-23 ENCOUNTER — Ambulatory Visit (HOSPITAL_COMMUNITY): Payer: BLUE CROSS/BLUE SHIELD

## 2016-11-28 ENCOUNTER — Encounter (HOSPITAL_COMMUNITY): Payer: BLUE CROSS/BLUE SHIELD | Admitting: Physical Therapy

## 2016-12-04 ENCOUNTER — Other Ambulatory Visit: Payer: BLUE CROSS/BLUE SHIELD | Admitting: Family Medicine

## 2016-12-13 ENCOUNTER — Telehealth: Payer: Self-pay | Admitting: *Deleted

## 2016-12-13 DIAGNOSIS — M48061 Spinal stenosis, lumbar region without neurogenic claudication: Secondary | ICD-10-CM

## 2016-12-13 MED ORDER — OXYCODONE-ACETAMINOPHEN 10-325 MG PO TABS: ORAL_TABLET | ORAL | 0 refills | Status: DC

## 2016-12-13 NOTE — Telephone Encounter (Signed)
Received call from patient.   Requested refill on Percocet.   Ok to refill??  Last office visit/ refill 11/06/2016.  Of note, patient with outstanding balance of $447.06.

## 2016-12-13 NOTE — Telephone Encounter (Signed)
Prescription printed.   Call placed to patient and patient made aware per VM per DPR.

## 2016-12-13 NOTE — Telephone Encounter (Signed)
Because of her [place] and dismissed status until paid She will have to be tapered off her pain medications. Take 1 tablet twice a day for 2 weeks, then 1/2 tablet twice a day for 2 weeks Then 1/2 tablet daily for 2`weeks #49 tablets

## 2016-12-28 ENCOUNTER — Telehealth: Payer: Self-pay | Admitting: *Deleted

## 2016-12-28 DIAGNOSIS — M48061 Spinal stenosis, lumbar region without neurogenic claudication: Secondary | ICD-10-CM

## 2016-12-28 NOTE — Telephone Encounter (Signed)
Received call from patient.   States that she has made payment agreement with office and has successfully made monthly payment.   Requested to refill complete prescription.   MD please advise.

## 2016-12-29 MED ORDER — OXYCODONE-ACETAMINOPHEN 10-325 MG PO TABS: 1.0000 | ORAL_TABLET | Freq: Three times a day (TID) | ORAL | 0 refills | Status: DC | PRN

## 2016-12-29 NOTE — Telephone Encounter (Signed)
She may have another script for 40 pills Percocet 1 po q 8 hrs prn pain, this is the remander of her script given on May 30th  She can not have her regular script for 90 until 6/28

## 2016-12-29 NOTE — Telephone Encounter (Signed)
Prescription printed and patient made aware to come to office to pick up after 3pm on 12/29/2016.

## 2017-01-22 ENCOUNTER — Encounter: Payer: Self-pay | Admitting: Family Medicine

## 2017-01-29 ENCOUNTER — Telehealth: Payer: Self-pay | Admitting: *Deleted

## 2017-01-29 DIAGNOSIS — M48061 Spinal stenosis, lumbar region without neurogenic claudication: Secondary | ICD-10-CM

## 2017-01-29 MED ORDER — OXYCODONE-ACETAMINOPHEN 10-325 MG PO TABS: 1.0000 | ORAL_TABLET | Freq: Three times a day (TID) | ORAL | 0 refills | Status: DC | PRN

## 2017-01-29 NOTE — Telephone Encounter (Signed)
Prescription printed and patient made aware to come to office to pick up on 01/30/2017.

## 2017-01-29 NOTE — Telephone Encounter (Signed)
Okay to give refill #90 tablets

## 2017-01-29 NOTE — Telephone Encounter (Signed)
Received call from patient. Requested refill on Percocet.   Ok to refill?? Last office visit 11/06/2016. Last refill 12/29/2016, #40 for the rest of May prescription. Patient did not request refill for June.

## 2017-02-07 ENCOUNTER — Ambulatory Visit (INDEPENDENT_AMBULATORY_CARE_PROVIDER_SITE_OTHER): Payer: BLUE CROSS/BLUE SHIELD | Admitting: Family Medicine

## 2017-02-07 ENCOUNTER — Encounter: Payer: Self-pay | Admitting: Family Medicine

## 2017-02-07 VITALS — BP 132/72 | HR 66 | Temp 98.1°F | Resp 16 | Ht 62.0 in | Wt 253.0 lb

## 2017-02-07 DIAGNOSIS — Z1211 Encounter for screening for malignant neoplasm of colon: Secondary | ICD-10-CM

## 2017-02-07 DIAGNOSIS — G8929 Other chronic pain: Secondary | ICD-10-CM

## 2017-02-07 DIAGNOSIS — N760 Acute vaginitis: Secondary | ICD-10-CM

## 2017-02-07 DIAGNOSIS — R011 Cardiac murmur, unspecified: Secondary | ICD-10-CM

## 2017-02-07 DIAGNOSIS — Z1231 Encounter for screening mammogram for malignant neoplasm of breast: Secondary | ICD-10-CM | POA: Diagnosis not present

## 2017-02-07 DIAGNOSIS — M5441 Lumbago with sciatica, right side: Secondary | ICD-10-CM | POA: Diagnosis not present

## 2017-02-07 DIAGNOSIS — Z1159 Encounter for screening for other viral diseases: Secondary | ICD-10-CM

## 2017-02-07 DIAGNOSIS — Z124 Encounter for screening for malignant neoplasm of cervix: Secondary | ICD-10-CM

## 2017-02-07 DIAGNOSIS — Z0000 Encounter for general adult medical examination without abnormal findings: Secondary | ICD-10-CM

## 2017-02-07 DIAGNOSIS — Z800 Family history of malignant neoplasm of digestive organs: Secondary | ICD-10-CM

## 2017-02-07 DIAGNOSIS — R195 Other fecal abnormalities: Secondary | ICD-10-CM

## 2017-02-07 DIAGNOSIS — Z1239 Encounter for other screening for malignant neoplasm of breast: Secondary | ICD-10-CM

## 2017-02-07 LAB — COMPREHENSIVE METABOLIC PANEL
ALT: 24 U/L (ref 6–29)
AST: 18 U/L (ref 10–35)
Albumin: 4.4 g/dL (ref 3.6–5.1)
Alkaline Phosphatase: 97 U/L (ref 33–130)
BUN: 12 mg/dL (ref 7–25)
CO2: 25 mmol/L (ref 20–31)
Calcium: 9.4 mg/dL (ref 8.6–10.4)
Chloride: 102 mmol/L (ref 98–110)
Creat: 0.63 mg/dL (ref 0.50–1.05)
Glucose, Bld: 94 mg/dL (ref 70–99)
Potassium: 3.7 mmol/L (ref 3.5–5.3)
Sodium: 143 mmol/L (ref 135–146)
Total Bilirubin: 0.3 mg/dL (ref 0.2–1.2)
Total Protein: 7.2 g/dL (ref 6.1–8.1)

## 2017-02-07 LAB — CBC WITH DIFFERENTIAL/PLATELET
Basophils Absolute: 70 cells/uL (ref 0–200)
Basophils Relative: 1 %
Eosinophils Absolute: 280 cells/uL (ref 15–500)
Eosinophils Relative: 4 %
HCT: 39.3 % (ref 35.0–45.0)
Hemoglobin: 12.8 g/dL (ref 12.0–15.0)
Lymphocytes Relative: 43 %
Lymphs Abs: 3010 cells/uL (ref 850–3900)
MCH: 29.3 pg (ref 27.0–33.0)
MCHC: 32.6 g/dL (ref 32.0–36.0)
MCV: 89.9 fL (ref 80.0–100.0)
MPV: 9.7 fL (ref 7.5–12.5)
Monocytes Absolute: 420 cells/uL (ref 200–950)
Monocytes Relative: 6 %
Neutro Abs: 3220 cells/uL (ref 1500–7800)
Neutrophils Relative %: 46 %
Platelets: 436 10*3/uL — ABNORMAL HIGH (ref 140–400)
RBC: 4.37 MIL/uL (ref 3.80–5.10)
RDW: 15.1 % — ABNORMAL HIGH (ref 11.0–15.0)
WBC: 7 10*3/uL (ref 3.8–10.8)

## 2017-02-07 LAB — WET PREP FOR TRICH, YEAST, CLUE
Clue Cells Wet Prep HPF POC: NONE SEEN
Trich, Wet Prep: NONE SEEN
WBC, Wet Prep HPF POC: NONE SEEN
Yeast Wet Prep HPF POC: NONE SEEN

## 2017-02-07 LAB — LIPID PANEL
Cholesterol: 208 mg/dL — ABNORMAL HIGH (ref <200)
HDL: 79 mg/dL (ref >50)
LDL Cholesterol: 116 mg/dL — ABNORMAL HIGH (ref <100)
Total CHOL/HDL Ratio: 2.6 Ratio (ref <5.0)
Triglycerides: 64 mg/dL (ref <150)
VLDL: 13 mg/dL (ref <30)

## 2017-02-07 MED ORDER — PHENTERMINE HCL 37.5 MG PO TABS: 37.5000 mg | ORAL_TABLET | Freq: Every day | ORAL | 1 refills | Status: DC

## 2017-02-07 MED ORDER — CLOBETASOL PROP EMOLLIENT BASE 0.05 % EX CREA: TOPICAL_CREAM | CUTANEOUS | 2 refills | Status: DC

## 2017-02-07 NOTE — Patient Instructions (Addendum)
Colonoscopy to be done  Heart murmur heard- 2D Echo to be done, DO NOT START THE WEIGHT LOSS MEDICATION UNTIL HEART TEST DONE  Try the phentermine take 1/2 tablet for 2 weeks, then increase to 1 tablet daily Schedule your mammogram  Continue pain medication  F/U 3 months for weight

## 2017-02-07 NOTE — Assessment & Plan Note (Addendum)
Will try phentermine if her Echo is benign Discussed nutrition, low carb/low sugar diet Increase water Discussed SE of the medication Start 1/2 tab or 2 weeks then increase to one full tablet

## 2017-02-07 NOTE — Assessment & Plan Note (Signed)
Pain medication refilled

## 2017-02-07 NOTE — Progress Notes (Signed)
Subjective:    Patient ID: Julia Avery, female    DOB: 12/01/1955, 61 y.o.   MRN: [id number]  Patient presents for CPE with PAP (is fasting) and Vaginal Itching   Pt here for CPE and PAP Smear   Mammogram- Due COlonoscopy- Due Immunizations- Due for TDAP  Labs from Feb showed mild elevated cholesterl / fasting glucose mildly elevated at 102 Due for Hep C screening  Wants something to help with Weight, has lost weight with meds in past, also interested in weight loss surgery   Itching in vaginal area on and off, gets worse with some soaps She does Douche a few times a year.   Has used some vagisil   Review Of Systems:  GEN- denies fatigue, fever, weight loss,weakness, recent illness HEENT- denies eye drainage, change in vision, nasal discharge, CVS- denies chest pain, palpitations RESP- denies SOB, cough, wheeze ABD- denies N/V, change in stools, abd pain GU- denies dysuria, hematuria, dribbling, incontinence MSK- + joint pain, muscle aches, injury Neuro- denies headache, dizziness, syncope, seizure activity       Objective:    BP 132/72   Pulse 66   Temp 98.1 F (36.7 C) (Oral)   Resp 16   Ht 5\' 2"  (1.575 m)   Wt 253 lb (114.8 kg)   SpO2 97%   BMI 46.27 kg/m  GEN- NAD, alert and oriented x3 HEENT- PERRL, EOMI, non injected sclera, pink conjunctiva, MMM, oropharynx clear Neck- Supple, no thyromegaly Breast- normal symmetry, no nipple inversion,no nipple drainage, no nodules or lumps felt Nodes- no axillary nodes CVS- RRR, right side 2/6 SEM RESP-CTAB ABD-NABS,soft,NT,ND GU- normal external genitalia BUT WITH hypopigmentation, mild maceration at creases of vaginal fold/groin, vaginal mucosa pink and moist, cervixdifficult to  Visualize,  no growth, no blood form os, minimal thin clear discharge, no CMT, no ovarian masses, unable to palpate entire uterus due to habitus,  Rectum normal tone, stool in valut FOBT POSITIVE  EXT- No edema Pulses- Radial, DP-  2+        Assessment & Plan:      Problem List Items Addressed This Visit    Morbid obesity ([place])    Will try phentermine if her Echo is benign Discussed nutrition, low carb/low sugar diet Increase water Discussed SE of the medication Start 1/2 tab or 2 weeks then increase to one full tablet      Relevant Medications   phentermine (ADIPEX-P) 37.5 MG tablet   Chronic back pain    Pain medication refilled       Other Visit Diagnoses    Routine general medical examination at a health care facility    -  Primary   CPE done. Patient is schedule mammogram. Positive guaiac stool and family history: Cancer for colonoscopy. We'll screen for hepatitis C Pap smear also attemptedA difficult secondary to her habitus.  Heart murmur also heard on exam which is new in the setting for her habitus obtain 2-D echo to evaluate she does not have any signs of heart failure.   She declines any other STDs screening or HIV testing.   Regards to the acute vaginitis there is no sign of infection on her wet prep. This might actually be more lichen sclerosis will try topical cortisone ointment.    Relevant Orders   Comprehensive metabolic panel   Lipid panel   CBC with Differential/Platelet   Cervical cancer screening       Relevant Orders   PAP, ThinPrep ASCUS Rflx HPV Rflx  Type   Breast cancer screening       Relevant Orders   MM SCREENING BREAST TOMO BILATERAL   Need for hepatitis C screening test       Relevant Orders   Hepatitis C antibody   Acute vaginitis    /LICHEN SClerosus - TRIAL OF CLOBETASOL   Relevant Orders   WET PREP FOR [place], YEAST, CLUE (Completed)   Colon cancer screening       Relevant Orders   Ambulatory referral to Gastroenterology   Guaiac positive stools       Relevant Orders   Ambulatory referral to Gastroenterology   Family history of colon cancer       Relevant Orders   Ambulatory referral to Gastroenterology   Heart murmur       Relevant Orders    ECHOCARDIOGRAM COMPLETE      Note: This dictation was prepared with Dragon dictation along with smaller phrase technology. Any transcriptional errors that result from this process are unintentional.

## 2017-02-08 LAB — HEPATITIS C ANTIBODY: HCV Ab: NEGATIVE

## 2017-02-09 LAB — PAP THINPREP ASCUS RFLX HPV RFLX TYPE

## 2017-02-12 ENCOUNTER — Telehealth: Payer: Self-pay | Admitting: *Deleted

## 2017-02-12 MED ORDER — TRIAMCINOLONE ACETONIDE 0.1 % EX CREA: 1.0000 "application " | TOPICAL_CREAM | Freq: Two times a day (BID) | CUTANEOUS | 0 refills | Status: DC

## 2017-02-12 NOTE — Telephone Encounter (Signed)
Prescription sent to pharmacy.

## 2017-02-12 NOTE — Telephone Encounter (Signed)
Try Triamcinolone 0.1% cream BID to vaginal area  Give lab results

## 2017-02-12 NOTE — Telephone Encounter (Signed)
Received fa from pharmacy.   Clobetasol is not covered by insurance and out of pocket will be $60.00.   Requested alternative.   MD please advise.

## 2017-02-15 ENCOUNTER — Ambulatory Visit (HOSPITAL_COMMUNITY)
Admission: RE | Admit: 2017-02-15 | Discharge: 2017-02-15 | Disposition: A | Discharge status: Home / Self Care | Payer: BLUE CROSS/BLUE SHIELD | Source: Ambulatory Visit | Attending: Family Medicine | Admitting: Family Medicine

## 2017-02-15 DIAGNOSIS — I517 Cardiomegaly: Secondary | ICD-10-CM | POA: Insufficient documentation

## 2017-02-15 DIAGNOSIS — R011 Cardiac murmur, unspecified: Secondary | ICD-10-CM | POA: Diagnosis present

## 2017-02-15 NOTE — Progress Notes (Signed)
*  PRELIMINARY RESULTS* Echocardiogram 2D Echocardiogram has been performed.  Leavy Cella 02/15/2017, 11:18 AM

## 2017-02-19 ENCOUNTER — Telehealth: Payer: Self-pay | Admitting: *Deleted

## 2017-02-19 ENCOUNTER — Encounter: Payer: Self-pay | Admitting: Gastroenterology

## 2017-02-19 DIAGNOSIS — M48061 Spinal stenosis, lumbar region without neurogenic claudication: Secondary | ICD-10-CM

## 2017-02-19 NOTE — Telephone Encounter (Signed)
Received call from patient.   States that he was not able to go to work D/T back and leg pain. Requested note for work.   MD please advise.

## 2017-02-19 NOTE — Telephone Encounter (Signed)
I am not sure what this is about? Please clarify She has OV last week for her physical. Okay to cover that day, But I did not take her OUT of work for anything

## 2017-02-20 MED ORDER — OXYCODONE-ACETAMINOPHEN 10-325 MG PO TABS: 1.0000 | ORAL_TABLET | Freq: Three times a day (TID) | ORAL | 0 refills | Status: DC | PRN

## 2017-02-20 NOTE — Telephone Encounter (Signed)
Okay to give note for this one day. I know her job does not give FMLA, so since she does not have that on file, for future reference  I can not legally cover her staying out of work random days  due to pain, especially if she is not seen that day.

## 2017-02-20 NOTE — Telephone Encounter (Signed)
Patient states that she was not taken out of work, but she was not able to go d/t increased pain in back and legs. States that she was having difficulty standing, sitting and walking.  Patient is requesting a work note for 02/19/2017.

## 2017-02-20 NOTE — Telephone Encounter (Signed)
Call placed to patient and patient made aware. Will pick up letter on 02/21/2017.  Patient also requested to refill prescription for Oxycodone post-dated for 03/01/2017. Ok to refill?

## 2017-02-20 NOTE — Telephone Encounter (Signed)
Prescription printed and patient made aware to come to office to pick up on 02/21/2017.

## 2017-02-20 NOTE — Telephone Encounter (Signed)
Okay to refill for august

## 2017-02-27 ENCOUNTER — Telehealth: Payer: Self-pay | Admitting: *Deleted

## 2017-02-27 DIAGNOSIS — M48061 Spinal stenosis, lumbar region without neurogenic claudication: Secondary | ICD-10-CM

## 2017-02-27 MED ORDER — OXYCODONE-ACETAMINOPHEN 10-325 MG PO TABS: 1.0000 | ORAL_TABLET | Freq: Three times a day (TID) | ORAL | 0 refills | Status: DC | PRN

## 2017-02-27 MED ORDER — PHENTERMINE HCL 37.5 MG PO TABS: 37.5000 mg | ORAL_TABLET | Freq: Every day | ORAL | 1 refills | Status: DC

## 2017-02-27 NOTE — Telephone Encounter (Signed)
Received call from patient.   Reports that she has misplaced written prescriptions for Phentermine and Oxycodone.   MD please advise.

## 2017-02-27 NOTE — Telephone Encounter (Signed)
Okay to give new script for this 1 time, If she does find the other prescription it needs to be discarded

## 2017-02-27 NOTE — Telephone Encounter (Signed)
Prescriptions printed and patient made aware to come to office to pick up after 4pm on 02/27/2017.

## 2017-03-06 LAB — HM MAMMOGRAPHY

## 2017-03-20 ENCOUNTER — Encounter: Payer: Self-pay | Admitting: Family Medicine

## 2017-03-23 ENCOUNTER — Encounter: Payer: Self-pay | Admitting: Family Medicine

## 2017-03-27 ENCOUNTER — Other Ambulatory Visit: Payer: Self-pay

## 2017-03-27 DIAGNOSIS — M48061 Spinal stenosis, lumbar region without neurogenic claudication: Secondary | ICD-10-CM

## 2017-03-27 MED ORDER — OXYCODONE-ACETAMINOPHEN 10-325 MG PO TABS: 1.0000 | ORAL_TABLET | Freq: Three times a day (TID) | ORAL | 0 refills | Status: DC | PRN

## 2017-03-27 NOTE — Telephone Encounter (Signed)
Okay to refill? 

## 2017-03-27 NOTE — Telephone Encounter (Signed)
Last OV 7/25 Last refill 8/14  okay to refill?

## 2017-03-28 NOTE — Telephone Encounter (Signed)
Patient aware rx can be picked up today

## 2017-04-02 ENCOUNTER — Encounter: Payer: Self-pay | Admitting: *Deleted

## 2017-04-05 ENCOUNTER — Ambulatory Visit (HOSPITAL_COMMUNITY): Payer: BLUE CROSS/BLUE SHIELD

## 2017-04-09 ENCOUNTER — Encounter: Payer: Self-pay | Admitting: Gastroenterology

## 2017-04-09 ENCOUNTER — Other Ambulatory Visit: Payer: Self-pay

## 2017-04-09 ENCOUNTER — Ambulatory Visit (INDEPENDENT_AMBULATORY_CARE_PROVIDER_SITE_OTHER): Payer: BLUE CROSS/BLUE SHIELD | Admitting: Gastroenterology

## 2017-04-09 DIAGNOSIS — Z800 Family history of malignant neoplasm of digestive organs: Secondary | ICD-10-CM

## 2017-04-09 DIAGNOSIS — R195 Other fecal abnormalities: Secondary | ICD-10-CM | POA: Diagnosis not present

## 2017-04-09 MED ORDER — PEG 3350-KCL-NA BICARB-NACL 420 G PO SOLR: 4000.0000 mL | ORAL | 0 refills | Status: DC

## 2017-04-09 NOTE — Progress Notes (Addendum)
Primary Care Physician:  Alycia Rossetti, MD  Primary Gastroenterologist:  Garfield Cornea, MD   Chief Complaint  Patient presents with  . heme positive stool     never had tcs, mother had colon cancer    HPI:  Julia Avery is a 61 y.o. female here for heme + stool at request of Dr. Buelah Manis. Patient has never had colonoscopy. Her mother died with colon cancer at age 82. Oldest brother has colon polyps.   She denies constipation, diarrhea, melena, brbpr. No ugi symptoms. No unintentional weight loss. She has chronic back pain and is on oxycodone/APAP and mobic.    Current Outpatient Prescriptions  Medication Sig Dispense Refill  . amLODipine (NORVASC) 10 MG tablet Take 1 tablet (10 mg total) by mouth daily. 90 tablet 2  . Clobetasol Prop Emollient Base (CLOBETASOL PROPIONATE E) 0.05 % emollient cream Apply to affected areas on vulva twice a day as needed 30 g 2  . meloxicam (MOBIC) 7.5 MG tablet Take 1 tablet (7.5 mg total) by mouth daily. 30 tablet 6  . oxyCODONE-acetaminophen (PERCOCET) 10-325 MG tablet Take 1 tablet by mouth every 8 (eight) hours as needed for pain. 90 tablet 0  . phentermine (ADIPEX-P) 37.5 MG tablet Take 1 tablet (37.5 mg total) by mouth daily before breakfast. 30 tablet 1  . triamcinolone cream (KENALOG) 0.1 % Apply 1 application topically 2 (two) times daily. 30 g 0   No current facility-administered medications for this visit.     Allergies as of 04/09/2017 - Review Complete 04/09/2017  Allergen Reaction Noted  . Penicillins    . Tramadol Rash 02/07/2017    Past Medical History:  Diagnosis Date  . Back pain   . Hypertension   . Obesity, Class III, BMI 40-49.9 (morbid obesity) ([place])     Past Surgical History:  Procedure Laterality Date  . TONSILLECTOMY    . TUBAL LIGATION      Family History  Problem Relation Age of Onset  . Cancer Mother 63       colon, diagnosed at age 54 was already metastatic  . Hypertension Father   . Heart  disease Father   . Diabetes Father   . Hypertension Brother   . Heart disease Brother   . Cancer Brother        prostate     Social History   Social History  . Marital status: Legally Separated    Spouse name: N/A  . Number of children: N/A  . Years of education: N/A   Occupational History  . Not on file.   Social History Main Topics  . Smoking status: Current Every Day Smoker    Types: Cigarettes  . Smokeless tobacco: Never Used  . Alcohol use No  . Drug use: No  . Sexual activity: Yes   Other Topics Concern  . Not on file   Social History Narrative  . No narrative on file      ROS:  General: Negative for anorexia, weight loss, fever, chills, fatigue, weakness. Eyes: Negative for vision changes.  ENT: Negative for hoarseness, difficulty swallowing , nasal congestion. CV: Negative for chest pain, angina, palpitations, dyspnea on exertion, peripheral edema.  Respiratory: Negative for dyspnea at rest, dyspnea on exertion, cough, sputum, wheezing.  GI: See history of present illness. GU:  Negative for dysuria, hematuria, urinary incontinence, urinary frequency, nocturnal urination.  MS: Negative for joint pain, chronic low back pain.  Derm: Negative for rash or itching.  Neuro: Negative for  weakness, abnormal sensation, seizure, frequent headaches, memory loss, confusion.  Psych: Negative for anxiety, depression, suicidal ideation, hallucinations.  Endo: Negative for unusual weight change.  Heme: Negative for bruising or bleeding. Allergy: Negative for rash or hives.    Physical Examination:  BP 130/89   Pulse 87   Temp (!) 97.1 F (36.2 C) (Oral)   Ht 5\' 2"  (1.575 m)   Wt 246 lb 6.4 oz (111.8 kg)   BMI 45.07 kg/m    General: Well-nourished, well-developed in no acute distress.  Head: Normocephalic, atraumatic.   Eyes: Conjunctiva pink, no icterus. Mouth: Oropharyngeal mucosa moist and pink , no lesions erythema or exudate. Neck: Supple without  thyromegaly, masses, or lymphadenopathy.  Lungs: Clear to auscultation bilaterally.  Heart: Regular rate and rhythm, no murmurs rubs or gallops.  Abdomen: Bowel sounds are normal, nontender, nondistended, no hepatosplenomegaly or masses, no abdominal bruits or    hernia , no rebound or guarding.   Rectal: deferred Extremities: No lower extremity edema. No clubbing or deformities.  Neuro: Alert and oriented x 4 , grossly normal neurologically.  Skin: Warm and dry, no rash or jaundice.   Psych: Alert and cooperative, normal mood and affect.  Labs: Lab Results  Component Value Date   WBC 7.0 02/07/2017   HGB 12.8 02/07/2017   HCT 39.3 02/07/2017   MCV 89.9 02/07/2017   PLT 436 (H) 02/07/2017   Lab Results  Component Value Date   CREATININE 0.63 02/07/2017   BUN 12 02/07/2017   NA 143 02/07/2017   K 3.7 02/07/2017   CL 102 02/07/2017   CO2 25 02/07/2017   Lab Results  Component Value Date   ALT 24 02/07/2017   AST 18 02/07/2017   ALKPHOS 97 02/07/2017   BILITOT 0.3 02/07/2017   Lab Results  Component Value Date   TSH 1.14 08/21/2016     Imaging Studies: No results found.

## 2017-04-09 NOTE — Progress Notes (Signed)
CC'D TO PCP °

## 2017-04-09 NOTE — Assessment & Plan Note (Signed)
61 y/o female with FH of colon cancer if 1st degree relative before age of 7 presenting for heme + stool with no GI symptoms. Plan on colonoscopy in OR with deep sedation given chronic pain medication use.  I have discussed the risks, alternatives, benefits with regards to but not limited to the risk of reaction to medication, bleeding, infection, perforation and the patient is agreeable to proceed. Written consent to be obtained.

## 2017-04-09 NOTE — Patient Instructions (Signed)
1. Colonoscopy as scheduled. Please see separate instructions. 

## 2017-04-11 ENCOUNTER — Encounter: Payer: Self-pay | Admitting: Family Medicine

## 2017-04-15 ENCOUNTER — Other Ambulatory Visit: Payer: Self-pay | Admitting: Family Medicine

## 2017-04-17 ENCOUNTER — Encounter: Payer: Self-pay | Admitting: Family Medicine

## 2017-04-23 ENCOUNTER — Telehealth: Payer: Self-pay

## 2017-04-23 NOTE — Telephone Encounter (Signed)
Pt called office. She received message.

## 2017-04-23 NOTE — Telephone Encounter (Signed)
Per CM, pt's Colonoscopy with Propofol with RMR was rescheduled d/t RMR will not be available 05/07/17. Procedure was rescheduled to 05/21/17 at 12:30pm. Pre-op changed to 05/16/17 at 12:45pm. Tried to call pt, no answer, LMOVM and informed her. New instructions mailed with pre-op letter.

## 2017-04-24 ENCOUNTER — Telehealth: Payer: Self-pay | Admitting: *Deleted

## 2017-04-24 DIAGNOSIS — M48061 Spinal stenosis, lumbar region without neurogenic claudication: Secondary | ICD-10-CM

## 2017-04-24 MED ORDER — OXYCODONE-ACETAMINOPHEN 10-325 MG PO TABS: 1.0000 | ORAL_TABLET | Freq: Three times a day (TID) | ORAL | 0 refills | Status: DC | PRN

## 2017-04-24 NOTE — Telephone Encounter (Signed)
Prescription printed and patient made aware to come to office to pick up.  

## 2017-04-24 NOTE — Telephone Encounter (Signed)
Patient in office to pay on bill.   Requested refill on Oxycodone.   Ok to refill??  Last office visit 02/07/2017.  Last refill 03/27/2017.

## 2017-04-24 NOTE — Telephone Encounter (Signed)
Okay 

## 2017-05-03 ENCOUNTER — Other Ambulatory Visit: Payer: Self-pay | Admitting: Family Medicine

## 2017-05-03 ENCOUNTER — Other Ambulatory Visit (HOSPITAL_COMMUNITY): Payer: BLUE CROSS/BLUE SHIELD

## 2017-05-03 NOTE — Telephone Encounter (Signed)
Ok to refill??  Last office visit 02/07/2017.  Last refill 02/27/2017, #1 refills.

## 2017-05-04 NOTE — Telephone Encounter (Signed)
Okay to refill? 

## 2017-05-04 NOTE — Telephone Encounter (Signed)
Rx ready and pt aware 

## 2017-05-11 ENCOUNTER — Ambulatory Visit (INDEPENDENT_AMBULATORY_CARE_PROVIDER_SITE_OTHER): Payer: BLUE CROSS/BLUE SHIELD | Admitting: Family Medicine

## 2017-05-11 ENCOUNTER — Encounter: Payer: Self-pay | Admitting: Family Medicine

## 2017-05-11 VITALS — BP 120/68 | HR 88 | Temp 98.0°F | Resp 16 | Ht 62.0 in | Wt 245.0 lb

## 2017-05-11 DIAGNOSIS — I10 Essential (primary) hypertension: Secondary | ICD-10-CM

## 2017-05-11 DIAGNOSIS — M5136 Other intervertebral disc degeneration, lumbar region: Secondary | ICD-10-CM

## 2017-05-11 DIAGNOSIS — M48061 Spinal stenosis, lumbar region without neurogenic claudication: Secondary | ICD-10-CM | POA: Diagnosis not present

## 2017-05-11 MED ORDER — OXYCODONE-ACETAMINOPHEN 10-325 MG PO TABS: 1.0000 | ORAL_TABLET | Freq: Three times a day (TID) | ORAL | 0 refills | Status: DC | PRN

## 2017-05-11 NOTE — Progress Notes (Signed)
   Subjective:    Patient ID: Julia Avery, female    DOB: [birth year], 61 y.o.   MRN: [id number]  Patient presents for 3 month F/U (is fasting)  Pt here to f/u chronicmedical probems  HTN- taking norvasc as prescribed      Mild cholesterl elevation at CPE in July  Obesity- weight down 8lbs since JUly, taking Phentermine    Typoically eats once a day, not drinking much water  Noticed clothes were different  Some walking , does situps, some weights upper body  24- hour recall- Chef Salad with crackers Ranch dressing Coffee, [employer]- 9pm,   Brunch- at [employer], eggs    Colonoscopy scheduled for November for heme positive stools    Declines flu shot     Review Of Systems:  GEN- denies fatigue, fever, weight loss,weakness, recent illness HEENT- denies eye drainage, change in vision, nasal discharge, CVS- denies chest pain, palpitations RESP- denies SOB, cough, wheeze ABD- denies N/V, change in stools, abd pain GU- denies dysuria, hematuria, dribbling, incontinence MSK- denies joint pain, muscle aches, injury Neuro- denies headache, dizziness, syncope, seizure activity       Objective:    BP 120/68   Pulse 88   Temp 98 F (36.7 C) (Oral)   Resp 16   Ht 5\' 2"  (1.575 m)   Wt 245 lb (111.1 kg)   SpO2 99%   BMI 44.81 kg/m  GEN- NAD, alert and oriented x3 HEENT- PERRL, EOMI, non injected sclera, pink conjunctiva, MMM, oropharynx clear CVS- RRR, no murmur RESP-CTAB EXT- No edema Pulses- Radial 2+        Assessment & Plan:      Problem List Items Addressed This Visit      Unprioritized   Spinal stenosis of lumbar region   Relevant Medications   oxyCODONE-acetaminophen (PERCOCET) 10-325 MG tablet   Morbid obesity ([facility])    Some weight loss noted Continue with phentermine Goal 10lb weight loss by next visit       Essential hypertension, benign - Primary    Controlled no changes       DDD (degenerative disc disease), lumbar    Chronic pain,  medications refilled      Relevant Medications   oxyCODONE-acetaminophen (PERCOCET) 10-325 MG tablet      Note: This dictation was prepared with Dragon dictation along with smaller phrase technology. Any transcriptional errors that result from this process are unintentional.

## 2017-05-11 NOTE — Patient Instructions (Signed)
Take 1/2  Tablet of phentermine for 1 week, then stop Then restart 1 week after colonoscopy 1/2 tablet for 1 week, go back up to 1 full tablet  F/U 3 months

## 2017-05-13 NOTE — Assessment & Plan Note (Signed)
Chronic pain, medications refilled

## 2017-05-13 NOTE — Assessment & Plan Note (Signed)
Controlled no changes 

## 2017-05-13 NOTE — Assessment & Plan Note (Signed)
Some weight loss noted Continue with phentermine Goal 10lb weight loss by next visit

## 2017-05-14 NOTE — Patient Instructions (Signed)
Julia Avery  05/14/2017     @PREFPERIOPPHARMACY @   Your procedure is scheduled on  05/21/2017 .  Report to [facility] at  1100  A.M.  Call this number if you have problems the morning of surgery:  [phone]   Remember:  Do not eat food or drink liquids after midnight.  Take these medicines the morning of surgery with A SIP OF WATER  Norvasc, mobic, oxycodone. STOP your phentermine until after your procedure.    Do not wear jewelry, make-up or nail polish.  Do not wear lotions, powders, or perfumes, or deoderant.  Do not shave 48 hours prior to surgery.  Men may shave face and neck.  Do not bring valuables to the hospital.  [facility] is not responsible for any belongings or valuables.  Contacts, dentures or bridgework may not be worn into surgery.  Leave your suitcase in the car.  After surgery it may be brought to your room.  For patients admitted to the hospital, discharge time will be determined by your treatment team.  Patients discharged the day of surgery will not be allowed to drive home.   Name and phone number of your driver:   family Special instructions:  Follow the diet and prep instructions given to you by Dr Roseanne Kaufman office.  Please read over the following fact sheets that you were given. Anesthesia Post-op Instructions and Care and Recovery After Surgery       Colonoscopy, Adult A colonoscopy is an exam to look at the entire large intestine. During the exam, a lubricated, bendable tube is inserted into the anus and then passed into the rectum, colon, and other parts of the large intestine. A colonoscopy is often done as a part of normal colorectal screening or in response to certain symptoms, such as anemia, persistent diarrhea, abdominal pain, and blood in the stool. The exam can help screen for and diagnose medical problems, including:  Tumors.  Polyps.  Inflammation.  Areas of bleeding.  Tell a health care provider  about:  Any allergies you have.  All medicines you are taking, including vitamins, herbs, eye drops, creams, and over-the-counter medicines.  Any problems you or family members have had with anesthetic medicines.  Any blood disorders you have.  Any surgeries you have had.  Any medical conditions you have.  Any problems you have had passing stool. What are the risks? Generally, this is a safe procedure. However, problems may occur, including:  Bleeding.  A tear in the intestine.  A reaction to medicines given during the exam.  Infection (rare).  What happens before the procedure? Eating and drinking restrictions Follow instructions from your health care provider about eating and drinking, which may include:  A few days before the procedure - follow a low-fiber diet. Avoid nuts, seeds, dried fruit, raw fruits, and vegetables.  1-3 days before the procedure - follow a clear liquid diet. Drink only clear liquids, such as clear broth or bouillon, black coffee or tea, clear juice, clear soft drinks or sports drinks, gelatin dessert, and popsicles. Avoid any liquids that contain red or purple dye.  On the day of the procedure - do not eat or drink anything during the 2 hours before the procedure, or within the time period that your health care provider recommends.  Bowel prep If you were prescribed an oral bowel prep to clean out your colon:  Take it as told by your health care  provider. Starting the day before your procedure, you will need to drink a large amount of medicated liquid. The liquid will cause you to have multiple loose stools until your stool is almost clear or light green.  If your skin or anus gets irritated from diarrhea, you may use these to relieve the irritation: ? Medicated wipes, such as adult wet wipes with aloe and vitamin E. ? A skin soothing-product like petroleum jelly.  If you vomit while drinking the bowel prep, take a break for up to 60 minutes and  then begin the bowel prep again. If vomiting continues and you cannot take the bowel prep without vomiting, call your health care provider.  General instructions  Ask your health care provider about changing or stopping your regular medicines. This is especially important if you are taking diabetes medicines or blood thinners.  Plan to have someone take you home from the hospital or clinic. What happens during the procedure?  An IV tube may be inserted into one of your veins.  You will be given medicine to help you relax (sedative).  To reduce your risk of infection: ? Your health care team will wash or sanitize their hands. ? Your anal area will be washed with soap.  You will be asked to lie on your side with your knees bent.  Your health care provider will lubricate a long, thin, flexible tube. The tube will have a camera and a light on the end.  The tube will be inserted into your anus.  The tube will be gently eased through your rectum and colon.  Air will be delivered into your colon to keep it open. You may feel some pressure or cramping.  The camera will be used to take images during the procedure.  A small tissue sample may be removed from your body to be examined under a microscope (biopsy). If any potential problems are found, the tissue will be sent to a lab for testing.  If small polyps are found, your health care provider may remove them and have them checked for cancer cells.  The tube that was inserted into your anus will be slowly removed. The procedure may vary among health care providers and hospitals. What happens after the procedure?  Your blood pressure, heart rate, breathing rate, and blood oxygen level will be monitored until the medicines you were given have worn off.  Do not drive for 24 hours after the exam.  You may have a small amount of blood in your stool.  You may pass gas and have mild abdominal cramping or bloating due to the air that was  used to inflate your colon during the exam.  It is up to you to get the results of your procedure. Ask your health care provider, or the department performing the procedure, when your results will be ready. This information is not intended to replace advice given to you by your health care provider. Make sure you discuss any questions you have with your health care provider. Document Released: 06/30/2000 Document Revised: 05/03/2016 Document Reviewed: 09/14/2015 Elsevier Interactive Patient Education  2018 [employer].  Colonoscopy, Adult, Care After This sheet gives you information about how to care for yourself after your procedure. Your health care provider may also give you more specific instructions. If you have problems or questions, contact your health care provider. What can I expect after the procedure? After the procedure, it is common to have:  A small amount of blood in your stool  for 24 hours after the procedure.  Some gas.  Mild abdominal cramping or bloating.  Follow these instructions at home: General instructions   For the first 24 hours after the procedure: ? Do not drive or use machinery. ? Do not sign important documents. ? Do not drink alcohol. ? Do your regular daily activities at a slower pace than normal. ? Eat soft, easy-to-digest foods. ? Rest often.  Take over-the-counter or prescription medicines only as told by your health care provider.  It is up to you to get the results of your procedure. Ask your health care provider, or the department performing the procedure, when your results will be ready. Relieving cramping and bloating  Try walking around when you have cramps or feel bloated.  Apply heat to your abdomen as told by your health care provider. Use a heat source that your health care provider recommends, such as a moist heat pack or a heating pad. ? Place a towel between your skin and the heat source. ? Leave the heat on for 20-30  minutes. ? Remove the heat if your skin turns bright red. This is especially important if you are unable to feel pain, heat, or cold. You may have a greater risk of getting burned. Eating and drinking  Drink enough fluid to keep your urine clear or pale yellow.  Resume your normal diet as instructed by your health care provider. Avoid heavy or fried foods that are hard to digest.  Avoid drinking alcohol for as long as instructed by your health care provider. Contact a health care provider if:  You have blood in your stool 2-3 days after the procedure. Get help right away if:  You have more than a small spotting of blood in your stool.  You pass large blood clots in your stool.  Your abdomen is swollen.  You have nausea or vomiting.  You have a fever.  You have increasing abdominal pain that is not relieved with medicine. This information is not intended to replace advice given to you by your health care provider. Make sure you discuss any questions you have with your health care provider. Document Released: 02/15/2004 Document Revised: 03/27/2016 Document Reviewed: 09/14/2015 Elsevier Interactive Patient Education  2018 [place] Anesthesia is a term that refers to techniques, procedures, and medicines that help a person stay safe and comfortable during a medical procedure. Monitored anesthesia care, or sedation, is one type of anesthesia. Your anesthesia specialist may recommend sedation if you will be having a procedure that does not require you to be unconscious, such as:  Cataract surgery.  A dental procedure.  A biopsy.  A colonoscopy.  During the procedure, you may receive a medicine to help you relax (sedative). There are three levels of sedation:  Mild sedation. At this level, you may feel awake and relaxed. You will be able to follow directions.  Moderate sedation. At this level, you will be sleepy. You may not remember the  procedure.  Deep sedation. At this level, you will be asleep. You will not remember the procedure.  The more medicine you are given, the deeper your level of sedation will be. Depending on how you respond to the procedure, the anesthesia specialist may change your level of sedation or the type of anesthesia to fit your needs. An anesthesia specialist will monitor you closely during the procedure. Let your health care provider know about:  Any allergies you have.  All medicines you are taking,  including vitamins, herbs, eye drops, creams, and over-the-counter medicines.  Any use of steroids (by mouth or as a cream).  Any problems you or family members have had with sedatives and anesthetic medicines.  Any blood disorders you have.  Any surgeries you have had.  Any medical conditions you have, such as sleep apnea.  Whether you are pregnant or may be pregnant.  Any use of cigarettes, alcohol, or street drugs. What are the risks? Generally, this is a safe procedure. However, problems may occur, including:  Getting too much medicine (oversedation).  Nausea.  Allergic reaction to medicines.  Trouble breathing. If this happens, a breathing tube may be used to help with breathing. It will be removed when you are awake and breathing on your own.  Heart trouble.  Lung trouble.  Before the procedure Staying hydrated Follow instructions from your health care provider about hydration, which may include:  Up to 2 hours before the procedure - you may continue to drink clear liquids, such as water, clear fruit juice, black coffee, and plain tea.  Eating and drinking restrictions Follow instructions from your health care provider about eating and drinking, which may include:  8 hours before the procedure - stop eating heavy meals or foods such as meat, fried foods, or fatty foods.  6 hours before the procedure - stop eating light meals or foods, such as toast or cereal.  6 hours  before the procedure - stop drinking milk or drinks that contain milk.  2 hours before the procedure - stop drinking clear liquids.  Medicines Ask your health care provider about:  Changing or stopping your regular medicines. This is especially important if you are taking diabetes medicines or blood thinners.  Taking medicines such as aspirin and ibuprofen. These medicines can thin your blood. Do not take these medicines before your procedure if your health care provider instructs you not to.  Tests and exams  You will have a physical exam.  You may have blood tests done to show: ? How well your kidneys and liver are working. ? How well your blood can clot.  General instructions  Plan to have someone take you home from the hospital or clinic.  If you will be going home right after the procedure, plan to have someone with you for 24 hours.  What happens during the procedure?  Your blood pressure, heart rate, breathing, level of pain and overall condition will be monitored.  An IV tube will be inserted into one of your veins.  Your anesthesia specialist will give you medicines as needed to keep you comfortable during the procedure. This may mean changing the level of sedation.  The procedure will be performed. After the procedure  Your blood pressure, heart rate, breathing rate, and blood oxygen level will be monitored until the medicines you were given have worn off.  Do not drive for 24 hours if you received a sedative.  You may: ? Feel sleepy, clumsy, or nauseous. ? Feel forgetful about what happened after the procedure. ? Have a sore throat if you had a breathing tube during the procedure. ? Vomit. This information is not intended to replace advice given to you by your health care provider. Make sure you discuss any questions you have with your health care provider. Document Released: 03/29/2005 Document Revised: 12/10/2015 Document Reviewed: 10/24/2015 Elsevier  Interactive Patient Education  2018 [place], Care After These instructions provide you with information about caring for yourself after your  procedure. Your health care provider may also give you more specific instructions. Your treatment has been planned according to current medical practices, but problems sometimes occur. Call your health care provider if you have any problems or questions after your procedure. What can I expect after the procedure? After your procedure, it is common to:  Feel sleepy for several hours.  Feel clumsy and have poor balance for several hours.  Feel forgetful about what happened after the procedure.  Have poor judgment for several hours.  Feel nauseous or vomit.  Have a sore throat if you had a breathing tube during the procedure.  Follow these instructions at home: For at least 24 hours after the procedure:   Do not: ? Participate in activities in which you could fall or become injured. ? Drive. ? Use heavy machinery. ? Drink alcohol. ? Take sleeping pills or medicines that cause drowsiness. ? Make important decisions or sign legal documents. ? Take care of children on your own.  Rest. Eating and drinking  Follow the diet that is recommended by your health care provider.  If you vomit, drink water, juice, or soup when you can drink without vomiting.  Make sure you have little or no nausea before eating solid foods. General instructions  Have a responsible adult stay with you until you are awake and alert.  Take over-the-counter and prescription medicines only as told by your health care provider.  If you smoke, do not smoke without supervision.  Keep all follow-up visits as told by your health care provider. This is important. Contact a health care provider if:  You keep feeling nauseous or you keep vomiting.  You feel light-headed.  You develop a rash.  You have a fever. Get help right away  if:  You have trouble breathing. This information is not intended to replace advice given to you by your health care provider. Make sure you discuss any questions you have with your health care provider. Document Released: 10/24/2015 Document Revised: 02/23/2016 Document Reviewed: 10/24/2015 Elsevier Interactive Patient Education  [employer].

## 2017-05-16 ENCOUNTER — Other Ambulatory Visit: Payer: Self-pay | Admitting: Family Medicine

## 2017-05-16 ENCOUNTER — Encounter (HOSPITAL_COMMUNITY)
Admission: RE | Admit: 2017-05-16 | Discharge: 2017-05-16 | Disposition: A | Discharge status: Home / Self Care | Payer: BLUE CROSS/BLUE SHIELD | Source: Ambulatory Visit | Attending: Internal Medicine | Admitting: Internal Medicine

## 2017-05-17 ENCOUNTER — Encounter: Payer: Self-pay | Admitting: Family Medicine

## 2017-05-18 ENCOUNTER — Encounter (HOSPITAL_COMMUNITY)
Admission: RE | Admit: 2017-05-18 | Discharge: 2017-05-18 | Disposition: A | Discharge status: Home / Self Care | Payer: BLUE CROSS/BLUE SHIELD | Source: Ambulatory Visit | Attending: Internal Medicine | Admitting: Internal Medicine

## 2017-05-18 ENCOUNTER — Other Ambulatory Visit: Payer: Self-pay

## 2017-05-18 ENCOUNTER — Encounter (HOSPITAL_COMMUNITY): Payer: Self-pay

## 2017-05-18 DIAGNOSIS — Z79899 Other long term (current) drug therapy: Secondary | ICD-10-CM | POA: Diagnosis not present

## 2017-05-18 DIAGNOSIS — D124 Benign neoplasm of descending colon: Secondary | ICD-10-CM | POA: Diagnosis present

## 2017-05-18 DIAGNOSIS — F17210 Nicotine dependence, cigarettes, uncomplicated: Secondary | ICD-10-CM | POA: Diagnosis not present

## 2017-05-18 DIAGNOSIS — M1990 Unspecified osteoarthritis, unspecified site: Secondary | ICD-10-CM | POA: Diagnosis not present

## 2017-05-18 DIAGNOSIS — Z800 Family history of malignant neoplasm of digestive organs: Secondary | ICD-10-CM | POA: Diagnosis not present

## 2017-05-18 DIAGNOSIS — K5730 Diverticulosis of large intestine without perforation or abscess without bleeding: Secondary | ICD-10-CM | POA: Diagnosis not present

## 2017-05-18 DIAGNOSIS — I10 Essential (primary) hypertension: Secondary | ICD-10-CM | POA: Diagnosis not present

## 2017-05-18 DIAGNOSIS — K640 First degree hemorrhoids: Secondary | ICD-10-CM | POA: Diagnosis not present

## 2017-05-18 DIAGNOSIS — Z880 Allergy status to penicillin: Secondary | ICD-10-CM | POA: Diagnosis not present

## 2017-05-18 HISTORY — DX: Unspecified osteoarthritis, unspecified site: M19.90

## 2017-05-18 LAB — BASIC METABOLIC PANEL
Anion gap: 11 (ref 5–15)
BUN: 11 mg/dL (ref 6–20)
CO2: 25 mmol/L (ref 22–32)
Calcium: 9.2 mg/dL (ref 8.9–10.3)
Chloride: 106 mmol/L (ref 101–111)
Creatinine, Ser: 0.64 mg/dL (ref 0.44–1.00)
GFR calc Af Amer: >60 mL/min (ref >60)
GFR calc non Af Amer: >60 mL/min (ref >60)
Glucose, Bld: 107 mg/dL — ABNORMAL HIGH (ref 65–99)
Potassium: 3.7 mmol/L (ref 3.5–5.1)
Sodium: 142 mmol/L (ref 135–145)

## 2017-05-18 LAB — CBC WITH DIFFERENTIAL/PLATELET
Basophils Absolute: 0.1 10*3/uL (ref 0.0–0.1)
Basophils Relative: 1 %
Eosinophils Absolute: 0.4 10*3/uL (ref 0.0–0.7)
Eosinophils Relative: 5 %
HCT: 39 % (ref 36.0–46.0)
Hemoglobin: 12.7 g/dL (ref 12.0–15.0)
Lymphocytes Relative: 44 %
Lymphs Abs: 3.6 10*3/uL (ref 0.7–4.0)
MCH: 29.7 pg (ref 26.0–34.0)
MCHC: 32.6 g/dL (ref 30.0–36.0)
MCV: 91.1 fL (ref 78.0–100.0)
Monocytes Absolute: 0.6 10*3/uL (ref 0.1–1.0)
Monocytes Relative: 7 %
Neutro Abs: 3.5 10*3/uL (ref 1.7–7.7)
Neutrophils Relative %: 43 %
Platelets: 437 10*3/uL — ABNORMAL HIGH (ref 150–400)
RBC: 4.28 MIL/uL (ref 3.87–5.11)
RDW: 16.1 % — ABNORMAL HIGH (ref 11.5–15.5)
WBC: 8.1 10*3/uL (ref 4.0–10.5)

## 2017-05-18 NOTE — Progress Notes (Signed)
   05/18/17 0713  OBSTRUCTIVE SLEEP APNEA  Have you ever been diagnosed with sleep apnea through a sleep study? No  Do you snore loudly (loud enough to be heard through closed doors)?  1  Do you often feel tired, fatigued, or sleepy during the daytime (such as falling asleep during driving or talking to someone)? 0  Has anyone observed you stop breathing during your sleep? 1  Do you have, or are you being treated for high blood pressure? 1  BMI more than 35 kg/m2? 1  Age > 50 (1-yes) 1  Neck circumference greater than:Female 16 inches or larger, Female 17inches or larger? 0  Female Gender (Yes=1) 0  Obstructive Sleep Apnea Score 5  Score 5 or greater  Results sent to PCP

## 2017-05-21 ENCOUNTER — Encounter (HOSPITAL_COMMUNITY): Payer: Self-pay | Admitting: *Deleted

## 2017-05-21 ENCOUNTER — Ambulatory Visit (HOSPITAL_COMMUNITY)
Admission: RE | Admit: 2017-05-21 | Discharge: 2017-05-21 | Disposition: A | Discharge status: Home / Self Care | Payer: BLUE CROSS/BLUE SHIELD | Source: Ambulatory Visit | Attending: Internal Medicine | Admitting: Internal Medicine

## 2017-05-21 ENCOUNTER — Encounter (HOSPITAL_COMMUNITY)
Admission: RE | Disposition: A | Discharge status: Home / Self Care | Payer: Self-pay | Source: Ambulatory Visit | Attending: Internal Medicine

## 2017-05-21 ENCOUNTER — Ambulatory Visit (HOSPITAL_COMMUNITY): Payer: BLUE CROSS/BLUE SHIELD | Admitting: Anesthesiology

## 2017-05-21 DIAGNOSIS — K5730 Diverticulosis of large intestine without perforation or abscess without bleeding: Secondary | ICD-10-CM | POA: Insufficient documentation

## 2017-05-21 DIAGNOSIS — R195 Other fecal abnormalities: Secondary | ICD-10-CM | POA: Diagnosis not present

## 2017-05-21 DIAGNOSIS — Z880 Allergy status to penicillin: Secondary | ICD-10-CM | POA: Insufficient documentation

## 2017-05-21 DIAGNOSIS — D124 Benign neoplasm of descending colon: Secondary | ICD-10-CM | POA: Insufficient documentation

## 2017-05-21 DIAGNOSIS — K635 Polyp of colon: Secondary | ICD-10-CM

## 2017-05-21 DIAGNOSIS — Z79899 Other long term (current) drug therapy: Secondary | ICD-10-CM | POA: Insufficient documentation

## 2017-05-21 DIAGNOSIS — K640 First degree hemorrhoids: Secondary | ICD-10-CM | POA: Insufficient documentation

## 2017-05-21 DIAGNOSIS — M1990 Unspecified osteoarthritis, unspecified site: Secondary | ICD-10-CM | POA: Insufficient documentation

## 2017-05-21 DIAGNOSIS — F17210 Nicotine dependence, cigarettes, uncomplicated: Secondary | ICD-10-CM | POA: Insufficient documentation

## 2017-05-21 DIAGNOSIS — Z800 Family history of malignant neoplasm of digestive organs: Secondary | ICD-10-CM

## 2017-05-21 DIAGNOSIS — I10 Essential (primary) hypertension: Secondary | ICD-10-CM | POA: Insufficient documentation

## 2017-05-21 HISTORY — PX: COLONOSCOPY WITH PROPOFOL: SHX5780

## 2017-05-21 HISTORY — PX: POLYPECTOMY: SHX5525

## 2017-05-21 SURGERY — COLONOSCOPY WITH PROPOFOL
Anesthesia: Monitor Anesthesia Care

## 2017-05-21 MED ORDER — LACTATED RINGERS IV SOLN
INTRAVENOUS | Status: DC
  Administered 2017-05-21: 11:00:00 via INTRAVENOUS

## 2017-05-21 MED ORDER — PROPOFOL 500 MG/50ML IV EMUL
INTRAVENOUS | Status: DC | PRN
  Administered 2017-05-21 (×2): 125 ug/kg/min via INTRAVENOUS

## 2017-05-21 MED ORDER — LIDOCAINE HCL (PF) 1 % IJ SOLN
INTRAMUSCULAR | Status: AC
  Filled 2017-05-21: qty 5

## 2017-05-21 MED ORDER — CHLORHEXIDINE GLUCONATE CLOTH 2 % EX PADS: 6.0000 | MEDICATED_PAD | Freq: Once | CUTANEOUS | Status: DC

## 2017-05-21 MED ORDER — PROPOFOL 10 MG/ML IV BOLUS
INTRAVENOUS | Status: DC | PRN
Start: 2017-05-21 — End: 2017-05-21
  Administered 2017-05-21 (×5): 20 mg via INTRAVENOUS

## 2017-05-21 MED ORDER — ONDANSETRON 4 MG PO TBDP
4.0000 mg | ORAL_TABLET | Freq: Once | ORAL | Status: AC
  Administered 2017-05-21: 4 mg via ORAL

## 2017-05-21 MED ORDER — MIDAZOLAM HCL 2 MG/2ML IJ SOLN
1.0000 mg | Freq: Once | INTRAMUSCULAR | Status: AC | PRN
Start: 2017-05-21 — End: 2017-05-21
  Administered 2017-05-21: 2 mg via INTRAVENOUS

## 2017-05-21 MED ORDER — ONDANSETRON 4 MG PO TBDP
ORAL_TABLET | ORAL | Status: AC
  Filled 2017-05-21: qty 1

## 2017-05-21 MED ORDER — LIDOCAINE HCL (CARDIAC) 10 MG/ML IV SOLN
INTRAVENOUS | Status: DC | PRN
  Administered 2017-05-21: 50 mg via INTRAVENOUS

## 2017-05-21 MED ORDER — MIDAZOLAM HCL 2 MG/2ML IJ SOLN
INTRAMUSCULAR | Status: AC
  Filled 2017-05-21: qty 2

## 2017-05-21 NOTE — Anesthesia Postprocedure Evaluation (Signed)
Anesthesia Post Note  Patient: Julia Avery  Procedure(s) Performed: COLONOSCOPY WITH PROPOFOL (N/A ) POLYPECTOMY  Patient location during evaluation: PACU Anesthesia Type: MAC Level of consciousness: awake and alert Pain management: satisfactory to patient Vital Signs Assessment: post-procedure vital signs reviewed and stable Respiratory status: spontaneous breathing Cardiovascular status: stable Postop Assessment: adequate PO intake and no apparent nausea or vomiting Anesthetic complications: no     Last Vitals:  Vitals:   05/21/17 1345  BP: 103/70  Pulse: 75  Resp: 16  Temp: 36.6 C  SpO2: 100%    Last Pain:  Vitals:   05/21/17 1110  PainSc: 5                  Geral Tuch

## 2017-05-21 NOTE — Op Note (Signed)
[facility] Patient Name: Julia Avery Procedure Date: 05/21/2017 1:05 PM MRN: [id number] Date of Birth: 07-Apr-1956 Attending MD: Norvel Richards , MD CSN: [id number] Age: 61 Admit Type: Outpatient Procedure:                Colonoscopy Indications:              Heme positive stool Providers:                Norvel Richards, MD, Lurline Del, RN, Rosina Lowenstein, RN Referring MD:              Medicines:                Propofol per Anesthesia Complications:            No immediate complications. Estimated Blood Loss:     Estimated blood loss was minimal. Procedure:                Pre-Anesthesia Assessment:                           - Prior to the procedure, a History and Physical                            was performed, and patient medications and                            allergies were reviewed. The patient's tolerance of                            previous anesthesia was also reviewed. The risks                            and benefits of the procedure and the sedation                            options and risks were discussed with the patient.                            All questions were answered, and informed consent                            was obtained. Prior Anticoagulants: The patient has                            taken no previous anticoagulant or antiplatelet                            agents. ASA Grade Assessment: II - A patient with                            mild systemic disease. After reviewing the risks  and benefits, the patient was deemed in                            satisfactory condition to undergo the procedure.                           After obtaining informed consent, the colonoscope                            was passed under direct vision. Throughout the                            procedure, the patient's blood pressure, pulse, and                            oxygen saturations  were monitored continuously. The                            EC-[id number] ([id number]) scope was introduced through                            the anus and advanced to the the cecum, identified                            by appendiceal orifice and ileocecal valve. The                            colonoscopy was performed without difficulty. The                            patient tolerated the procedure well. The quality                            of the bowel preparation was adequate. The                            ileocecal valve, appendiceal orifice, and rectum                            were photographed. The quality of the bowel                            preparation was adequate. Scope In: 1:18:21 PM Scope Out: 1:34:02 PM Scope Withdrawal Time: 0 hours 10 minutes 47 seconds  Total Procedure Duration: 0 hours 15 minutes 41 seconds  Findings:      The perianal and digital rectal examinations were normal.      Scattered small and large-mouthed diverticula were found in the entire       colon.      A 6 mm polyp was found in the descending colon. The polyp was sessile.       The polyp was removed with a cold snare. Resection and retrieval were       complete. Estimated blood loss was minimal.      Non-bleeding internal hemorrhoids were found during retroflexion.  The       hemorrhoids were moderate, medium-sized and Grade I (internal       hemorrhoids that do not prolapse).      The exam was otherwise without abnormality on direct and retroflexion       views. Impression:               - Diverticulosis in the entire examined colon.                           - One 6 mm polyp in the descending colon, removed                            with a cold snare. Resected and retrieved.                           - Non-bleeding internal hemorrhoids.                           - The examination was otherwise normal on direct                            and retroflexion views. Moderate Sedation:      Moderate  (conscious) sedation was personally administered by an       anesthesia professional. The following parameters were monitored: oxygen       saturation, heart rate, blood pressure, respiratory rate, EKG, adequacy       of pulmonary ventilation, and response to care. Total physician       intraservice time was 23 minutes. Recommendation:           - Patient has a contact number available for                            emergencies. The signs and symptoms of potential                            delayed complications were discussed with the                            patient. Return to normal activities tomorrow.                            Written discharge instructions were provided to the                            patient.                           - Resume previous diet.                           - Continue present medications.                           - Repeat colonoscopy date to be determined after  pending pathology results are reviewed for                            surveillance based on pathology results.                           - Return to GI office (date not yet determined). Procedure Code(s):        --- Professional ---                           [phone], Colonoscopy, flexible; with removal of                            tumor(s), polyp(s), or other lesion(s) by snare                            technique Diagnosis Code(s):        --- Professional ---                           K64.0, First degree hemorrhoids                           D12.4, Benign neoplasm of descending colon                           R19.5, Other fecal abnormalities                           K57.30, Diverticulosis of large intestine without                            perforation or abscess without bleeding CPT copyright 2016 American Medical Association. All rights reserved. The codes documented in this report are preliminary and upon coder review may  be revised to meet current compliance  requirements. Cristopher Estimable. Rourk, MD Norvel Richards, MD 05/21/2017 1:40:00 PM This report has been signed electronically. Number of Addenda: 0

## 2017-05-21 NOTE — Anesthesia Procedure Notes (Signed)
Procedure Name: MAC Date/Time: 05/21/2017 1:07 PM Performed by: Vista Deck, CRNA Pre-anesthesia Checklist: Patient identified, Emergency Drugs available, Suction available, Timeout performed and Patient being monitored Patient Re-evaluated:Patient Re-evaluated prior to induction Oxygen Delivery Method: Non-rebreather mask

## 2017-05-21 NOTE — Transfer of Care (Signed)
Immediate Anesthesia Transfer of Care Note  Patient: Julia Avery  Procedure(s) Performed: COLONOSCOPY WITH PROPOFOL (N/A ) POLYPECTOMY  Patient Location: PACU  Anesthesia Type:MAC  Level of Consciousness: awake and alert   Airway & Oxygen Therapy: Patient Spontanous Breathing and Patient connected to nasal cannula oxygen  Post-op Assessment: Report given to RN and Post -op Vital signs reviewed and stable  Post vital signs: Reviewed and stable  Last Vitals: There were no vitals filed for this visit.  Last Pain:  Vitals:   05/21/17 1110  PainSc: 5       Patients Stated Pain Goal: 5 (56/21/30 8657)  Complications: No apparent anesthesia complications

## 2017-05-21 NOTE — Discharge Instructions (Signed)
Hemorrhoids °Hemorrhoids are swollen veins in and around the rectum or anus. There are two types of hemorrhoids: °· Internal hemorrhoids. These occur in the veins that are just inside the rectum. They may poke through to the outside and become irritated and painful. °· External hemorrhoids. These occur in the veins that are outside of the anus and can be felt as a painful swelling or hard lump near the anus. ° °Most hemorrhoids do not cause serious problems, and they can be managed with home treatments such as diet and lifestyle changes. If home treatments do not help your symptoms, procedures can be done to shrink or remove the hemorrhoids. °What are the causes? °This condition is caused by increased pressure in the anal area. This pressure may result from various things, including: °· Constipation. °· Straining to have a bowel movement. °· Diarrhea. °· Pregnancy. °· Obesity. °· Sitting for long periods of time. °· Heavy lifting or other activity that causes you to strain. °· Anal sex. ° °What are the signs or symptoms? °Symptoms of this condition include: °· Pain. °· Anal itching or irritation. °· Rectal bleeding. °· Leakage of stool (feces). °· Anal swelling. °· One or more lumps around the anus. ° °How is this diagnosed? °This condition can often be diagnosed through a visual exam. Other exams or tests may also be done, such as: °· Examination of the rectal area with a gloved hand (digital rectal exam). °· Examination of the anal canal using a small tube (anoscope). °· A blood test, if you have lost a significant amount of blood. °· A test to look inside the colon (sigmoidoscopy or colonoscopy). ° °How is this treated? °This condition can usually be treated at home. However, various procedures may be done if dietary changes, lifestyle changes, and other home treatments do not help your symptoms. These procedures can help make the hemorrhoids smaller or remove them completely. Some of these procedures involve  surgery, and others do not. Common procedures include: °· Rubber band ligation. Rubber bands are placed at the base of the hemorrhoids to cut off the blood supply to them. °· Sclerotherapy. Medicine is injected into the hemorrhoids to shrink them. °· Infrared coagulation. A type of light energy is used to get rid of the hemorrhoids. °· Hemorrhoidectomy surgery. The hemorrhoids are surgically removed, and the veins that supply them are tied off. °· Stapled hemorrhoidopexy surgery. A circular stapling device is used to remove the hemorrhoids and use staples to cut off the blood supply to them. ° °Follow these instructions at home: °Eating and drinking °· Eat foods that have a lot of fiber in them, such as whole grains, beans, nuts, fruits, and vegetables. Ask your health care provider about taking products that have added fiber (fiber supplements). °· Drink enough fluid to keep your urine clear or pale yellow. °Managing pain and swelling °· Take warm sitz baths for 20 minutes, 3-4 times a day to ease pain and discomfort. °· If directed, apply ice to the affected area. Using ice packs between sitz baths may be helpful. °? Put ice in a plastic bag. °? Place a towel between your skin and the bag. °? Leave the ice on for 20 minutes, 2-3 times a day. °General instructions °· Take over-the-counter and prescription medicines only as told by your health care provider. °· Use medicated creams or suppositories as told. °· Exercise regularly. °· Go to the bathroom when you have the urge to have a bowel movement. Do not wait. °·   Avoid straining to have bowel movements.  Keep the anal area dry and clean. Use wet toilet paper or moist towelettes after a bowel movement.  Do not sit on the toilet for long periods of time. This increases blood pooling and pain. Contact a health care provider if:  You have increasing pain and swelling that are not controlled by treatment or medicine.  You have uncontrolled bleeding.  You  have difficulty having a bowel movement, or you are unable to have a bowel movement.  You have pain or inflammation outside the area of the hemorrhoids. This information is not intended to replace advice given to you by your health care provider. Make sure you discuss any questions you have with your health care provider. Document Released: 06/30/2000 Document Revised: 12/01/2015 Document Reviewed: 03/17/2015 Elsevier Interactive Patient Education  2017 [employer] Diverticulosis Diverticulosis is a condition that develops when small pouches (diverticula) form in the wall of the large intestine (colon). The colon is where water is absorbed and stool is formed. The pouches form when the inside layer of the colon pushes through weak spots in the outer layers of the colon. You may have a few pouches or many of them. What are the causes? The cause of this condition is not known. What increases the risk? The following factors may make you more likely to develop this condition:  Being older than age 40. Your risk for this condition increases with age. Diverticulosis is rare among people younger than age 12. By age 60, many people have it.  Eating a low-fiber diet.  Having frequent constipation.  Being overweight.  Not getting enough exercise.  Smoking.  Taking over-the-counter pain medicines, like aspirin and ibuprofen.  Having a family history of diverticulosis.  What are the signs or symptoms? In most people, there are no symptoms of this condition. If you do have symptoms, they may include:  Bloating.  Cramps in the abdomen.  Constipation or diarrhea.  Pain in the lower left side of the abdomen.  How is this diagnosed? This condition is most often diagnosed during an exam for other colon problems. Because diverticulosis usually has no symptoms, it often cannot be diagnosed independently. This condition may be diagnosed by:  Using a flexible scope to examine the colon  (colonoscopy).  Taking an X-ray of the colon after dye has been put into the colon (barium enema).  Doing a CT scan.  How is this treated? You may not need treatment for this condition if you have never developed an infection related to diverticulosis. If you have had an infection before, treatment may include:  Eating a high-fiber diet. This may include eating more fruits, vegetables, and grains.  Taking a fiber supplement.  Taking a live bacteria supplement (probiotic).  Taking medicine to relax your colon.  Taking antibiotic medicines.  Follow these instructions at home:  Drink 6-8 glasses of water or more each day to prevent constipation.  Try not to strain when you have a bowel movement.  If you have had an infection before: ? Eat more fiber as directed by your health care provider or your diet and nutrition specialist (dietitian). ? Take a fiber supplement or probiotic, if your health care provider approves.  Take over-the-counter and prescription medicines only as told by your health care provider.  If you were prescribed an antibiotic, take it as told by your health care provider. Do not stop taking the antibiotic even if you start to feel better.  Keep all  follow-up visits as told by your health care provider. This is important. Contact a health care provider if:  You have pain in your abdomen.  You have bloating.  You have cramps.  You have not had a bowel movement in 3 days. Get help right away if:  Your pain gets worse.  Your bloating becomes very bad.  You have a fever or chills, and your symptoms suddenly get worse.  You vomit.  You have bowel movements that are bloody or black.  You have bleeding from your rectum. Summary  Diverticulosis is a condition that develops when small pouches (diverticula) form in the wall of the large intestine (colon).  You may have a few pouches or many of them.  This condition is most often diagnosed during an  exam for other colon problems.  If you have had an infection related to diverticulosis, treatment may include increasing the fiber in your diet, taking supplements, or taking medicines. This information is not intended to replace advice given to you by your health care provider. Make sure you discuss any questions you have with your health care provider. Document Released: 03/30/2004 Document Revised: 05/22/2016 Document Reviewed: 05/22/2016 Elsevier Interactive Patient Education  2017 [employer]. Colon Polyps Polyps are tissue growths inside the body. Polyps can grow in many places, including the large intestine (colon). A polyp may be a round bump or a mushroom-shaped growth. You could have one polyp or several. Most colon polyps are noncancerous (benign). However, some colon polyps can become cancerous over time. What are the causes? The exact cause of colon polyps is not known. What increases the risk? This condition is more likely to develop in people who:  Have a family history of colon cancer or colon polyps.  Are older than 62 or older than 45 if they are African American.  Have inflammatory bowel disease, such as ulcerative colitis or Crohn disease.  Are overweight.  Smoke cigarettes.  Do not get enough exercise.  Drink too much alcohol.  Eat a diet that is: ? High in fat and red meat. ? Low in fiber.  Had childhood cancer that was treated with abdominal radiation.  What are the signs or symptoms? Most polyps do not cause symptoms. If you have symptoms, they may include:  Blood coming from your rectum when having a bowel movement.  Blood in your stool.The stool may look dark red or black.  A change in bowel habits, such as constipation or diarrhea.  How is this diagnosed? This condition is diagnosed with a colonoscopy. This is a procedure that uses a lighted, flexible scope to look at the inside of your colon. How is this treated? Treatment for this  condition involves removing any polyps that are found. Those polyps will then be tested for cancer. If cancer is found, your health care provider will talk to you about options for colon cancer treatment. Follow these instructions at home: Diet  Eat plenty of fiber, such as fruits, vegetables, and whole grains.  Eat foods that are high in calcium and vitamin D, such as milk, cheese, yogurt, eggs, liver, fish, and broccoli.  Limit foods high in fat, red meats, and processed meats, such as hot dogs, sausage, bacon, and lunch meats.  Maintain a healthy weight, or lose weight if recommended by your health care provider. General instructions  Do not smoke cigarettes.  Do not drink alcohol excessively.  Keep all follow-up visits as told by your health care provider. This is important. This  includes keeping regularly scheduled colonoscopies. Talk to your health care provider about when you need a colonoscopy.  Exercise every day or as told by your health care provider. Contact a health care provider if:  You have new or worsening bleeding during a bowel movement.  You have new or increased blood in your stool.  You have a change in bowel habits.  You unexpectedly lose weight. This information is not intended to replace advice given to you by your health care provider. Make sure you discuss any questions you have with your health care provider. Document Released: 03/29/2004 Document Revised: 12/09/2015 Document Reviewed: 05/24/2015 Elsevier Interactive Patient Education  2018 [employer].  Colonoscopy Discharge Instructions  Read the instructions outlined below and refer to this sheet in the next few weeks. These discharge instructions provide you with general information on caring for yourself after you leave the hospital. Your doctor may also give you specific instructions. While your treatment has been planned according to the most current medical practices available, unavoidable  complications occasionally occur. If you have any problems or questions after discharge, call Dr. Gala Romney at [phone]. ACTIVITY  You may resume your regular activity, but move at a slower pace for the next 24 hours.   Take frequent rest periods for the next 24 hours.   Walking will help get rid of the air and reduce the bloated feeling in your belly (abdomen).   No driving for 24 hours (because of the medicine (anesthesia) used during the test).    Do not sign any important legal documents or operate any machinery for 24 hours (because of the anesthesia used during the test).  NUTRITION  Drink plenty of fluids.   You may resume your normal diet as instructed by your doctor.   Begin with a light meal and progress to your normal diet. Heavy or fried foods are harder to digest and may make you feel sick to your stomach (nauseated).   Avoid alcoholic beverages for 24 hours or as instructed.  MEDICATIONS  You may resume your normal medications unless your doctor tells you otherwise.  WHAT YOU CAN EXPECT TODAY  Some feelings of bloating in the abdomen.   Passage of more gas than usual.   Spotting of blood in your stool or on the toilet paper.  IF YOU HAD POLYPS REMOVED DURING THE COLONOSCOPY:  No aspirin products for 7 days or as instructed.   No alcohol for 7 days or as instructed.   Eat a soft diet for the next 24 hours.  FINDING OUT THE RESULTS OF YOUR TEST Not all test results are available during your visit. If your test results are not back during the visit, make an appointment with your caregiver to find out the results. Do not assume everything is normal if you have not heard from your caregiver or the medical facility. It is important for you to follow up on all of your test results.  SEEK IMMEDIATE MEDICAL ATTENTION IF:  You have more than a spotting of blood in your stool.   Your belly is swollen (abdominal distention).   You are nauseated or vomiting.   You have a  temperature over 101.   You have abdominal pain or discomfort that is severe or gets worse throughout the day.    Colon polyp, diverticulosis and hemorrhoid information provided  Further recommendations to follow pending review of pathology report

## 2017-05-21 NOTE — H&P (Signed)
@LOGO @   Primary Care Physician:  Alycia Rossetti, MD Primary Gastroenterologist:  Dr. Gala Romney  Pre-Procedure History & Physical: HPI:  Julia Avery is a 61 y.o. female here for for colonoscopy. Hemoccult positive. No prior colonoscopy. No bowel symptoms. Mother succumbed to colon cancer young age  -  less than 42  Past Medical History:  Diagnosis Date  . Arthritis   . Back pain   . Hypertension   . Obesity, Class III, BMI 40-49.9 (morbid obesity) ([place])     Past Surgical History:  Procedure Laterality Date  . TONSILLECTOMY    . TUBAL LIGATION      Prior to Admission medications   Medication Sig Start Date End Date Taking? Authorizing Provider  amLODipine (NORVASC) 10 MG tablet TAKE ONE TABLET BY MOUTH ONCE DAILY 05/16/17  Yes [place], Modena Nunnery, MD  meloxicam (MOBIC) 7.5 MG tablet TAKE ONE TABLET BY MOUTH ONCE DAILY Patient taking differently: TAKE 7.5 MG BY MOUTH ONCE DAILY 04/16/17  Yes [place], Modena Nunnery, MD  phentermine (ADIPEX-P) 37.5 MG tablet TAKE 1 TABLET BY MOUTH ONCE DAILY BEFORE BREAKFAST Patient taking differently: TAKE 37.5 MG BY MOUTH ONCE DAILY BEFORE BREAKFAST 05/04/17  Yes [place], Modena Nunnery, MD  Clobetasol Prop Emollient Base (CLOBETASOL PROPIONATE E) 0.05 % emollient cream Apply to affected areas on vulva twice a day as needed 02/07/17   Alycia Rossetti, MD  oxyCODONE-acetaminophen (PERCOCET) 10-325 MG tablet Take 1 tablet by mouth every 8 (eight) hours as needed for pain. 05/11/17   [place], Modena Nunnery, MD  polyethylene glycol-electrolytes (TRILYTE) 420 g solution Take 4,000 mLs by mouth as directed. 04/09/17   Daneil Dolin, MD    Allergies as of 04/09/2017 - Review Complete 04/09/2017  Allergen Reaction Noted  . Penicillins    . Tramadol Rash 02/07/2017    Family History  Problem Relation Age of Onset  . Cancer Mother 73       colon, diagnosed at age 77 was already metastatic  . Hypertension Father   . Heart disease Father   . Diabetes  Father   . Hypertension Brother   . Heart disease Brother   . Cancer Brother        prostate     Social History   Socioeconomic History  . Marital status: Legally Separated    Spouse name: Not on file  . Number of children: Not on file  . Years of education: Not on file  . Highest education level: Not on file  Social Needs  . Financial resource strain: Not on file  . Food insecurity - worry: Not on file  . Food insecurity - inability: Not on file  . Transportation needs - medical: Not on file  . Transportation needs - non-medical: Not on file  Occupational History  . Not on file  Tobacco Use  . Smoking status: Current Every Day Smoker    Packs/day: 0.25    Years: 50.00    Pack years: 12.50    Types: Cigarettes  . Smokeless tobacco: Never Used  Substance and Sexual Activity  . Alcohol use: No  . Drug use: No  . Sexual activity: Yes    Birth control/protection: Post-menopausal  Other Topics Concern  . Not on file  Social History Narrative  . Not on file    Review of Systems: See HPI, otherwise negative ROS  Physical Exam: There were no vitals taken for this visit. General:   Alert,  Well-developed, well-nourished, pleasant and cooperative in NAD  rvical adenopathy. Lungs:  Clear throughout to auscultation.   No wheezes, crackles, or rhonchi. No acute distress. Heart:  Regular rate and rhythm; no murmurs, clicks, rubs,  or gallops. Abdomen: Non-distended, normal bowel sounds.  Soft and nontender without appreciable mass or hepatosplenomegaly.  Pulses:  Normal pulses noted. Extremities:  Without clubbing or edema.  Impression:    Pleasant 61 year old lady Hemoccult positive. Positive family history colon cancer. No prior colonoscopy.  Recommendations:  I have offered the patient  A diagnostic colonoscopy.  The risks, benefits, limitations, alternatives and imponderables have been reviewed with the patient. Questions have been answered. All parties are agreeable.      Notice: This dictation was prepared with Dragon dictation along with smaller phrase technology. Any transcriptional errors that result from this process are unintentional and may not be corrected upon review.

## 2017-05-21 NOTE — Anesthesia Preprocedure Evaluation (Signed)
Anesthesia Evaluation  Patient identified by MRN, date of birth, ID band Patient awake    Airway Mallampati: I  TM Distance: >3 FB     Dental  (+) Teeth Intact   Pulmonary Current Smoker,    Pulmonary exam normal        Cardiovascular Exercise Tolerance: Poor hypertension, Pt. on medications Normal cardiovascular exam Rhythm:Regular Rate:Normal  - Left ventricle: The cavity size was normal. Wall thickness was   increased in a pattern of mild LVH. Systolic function was normal.   The estimated ejection fraction was in the range of 60% to 65%.   Wall motion was normal; there were no regional wall motion   Abnormalities.  (2018)  Normal sinus rhythm Minimal voltage criteria for LVH, may be normal variant Cannot rule out Anterior infarct , age undetermined Abnormal ECG   (nov 2018)   Neuro/Psych    GI/Hepatic   Endo/Other    Renal/GU Results for ANDREYA, LACKS (MRN [id number]) as of 05/21/2017 11:10  05/18/2017 07:12 Sodium: 142 Potassium: 3.7 Chloride: 106 CO2: 25 Glucose: 107 (H) BUN: 11 Creatinine: 0.64      Musculoskeletal   Abdominal Normal abdominal exam  (+)  Abdomen: soft.    Peds  Hematology   Anesthesia Other Findings   Reproductive/Obstetrics                             Anesthesia Physical Anesthesia Plan  ASA: III  Anesthesia Plan: MAC   Post-op Pain Management:    Induction:   PONV Risk Score and Plan: 1  Airway Management Planned: Simple Face Mask  Additional Equipment:   Intra-op Plan:   Post-operative Plan:   Informed Consent: I have reviewed the patients History and Physical, chart, labs and discussed the procedure including the risks, benefits and alternatives for the proposed anesthesia with the patient or authorized representative who has indicated his/her understanding and acceptance.   Dental advisory given  Plan Discussed with:  CRNA  Anesthesia Plan Comments:         Anesthesia Quick Evaluation

## 2017-05-23 ENCOUNTER — Encounter: Payer: Self-pay | Admitting: Internal Medicine

## 2017-05-24 ENCOUNTER — Encounter (HOSPITAL_COMMUNITY): Payer: Self-pay | Admitting: Internal Medicine

## 2017-06-04 ENCOUNTER — Telehealth: Payer: Self-pay | Admitting: *Deleted

## 2017-06-04 DIAGNOSIS — M48061 Spinal stenosis, lumbar region without neurogenic claudication: Secondary | ICD-10-CM

## 2017-06-04 MED ORDER — OXYCODONE-ACETAMINOPHEN 10-325 MG PO TABS: 1.0000 | ORAL_TABLET | Freq: Three times a day (TID) | ORAL | 0 refills | Status: DC | PRN

## 2017-06-04 NOTE — Telephone Encounter (Signed)
Okay to print but too early to refill at pharmacy

## 2017-06-04 NOTE — Telephone Encounter (Signed)
Records indicate prescription refill appropriate for Percocet.  Ok to refill??  Last office visit/ refill 05/11/2017

## 2017-06-04 NOTE — Telephone Encounter (Signed)
Prescription printed and patient made aware to come to office to pick up.  

## 2017-06-13 ENCOUNTER — Ambulatory Visit (INDEPENDENT_AMBULATORY_CARE_PROVIDER_SITE_OTHER): Payer: BLUE CROSS/BLUE SHIELD | Admitting: Family Medicine

## 2017-06-13 ENCOUNTER — Encounter: Payer: Self-pay | Admitting: Family Medicine

## 2017-06-13 ENCOUNTER — Other Ambulatory Visit: Payer: Self-pay

## 2017-06-13 VITALS — BP 122/64 | HR 82 | Temp 98.4°F | Resp 16 | Ht 62.0 in | Wt 245.0 lb

## 2017-06-13 DIAGNOSIS — M48061 Spinal stenosis, lumbar region without neurogenic claudication: Secondary | ICD-10-CM | POA: Diagnosis not present

## 2017-06-13 DIAGNOSIS — M5136 Other intervertebral disc degeneration, lumbar region: Secondary | ICD-10-CM | POA: Diagnosis not present

## 2017-06-13 MED ORDER — PREGABALIN 75 MG PO CAPS: 75.0000 mg | ORAL_CAPSULE | Freq: Two times a day (BID) | ORAL | 0 refills | Status: DC

## 2017-06-13 MED ORDER — TRIAMCINOLONE ACETONIDE 0.1 % EX CREA: 1.0000 "application " | TOPICAL_CREAM | Freq: Two times a day (BID) | CUTANEOUS | 0 refills | Status: DC

## 2017-06-13 NOTE — Patient Instructions (Addendum)
F/U as needed  Try the lyrica Referral to pain clinic

## 2017-06-13 NOTE — Progress Notes (Signed)
   Subjective:    Patient ID: Julia Avery, female    DOB: 02/28/1956, 61 y.o.   MRN: [id number]  Patient presents for Back Pain (mid back pain radiating down R side into leg)  Pt here with acute on chronic back pain Has known spinal stenosis, DDD, on chronic pain medicaiton. Past few days had increaesd right sided pain with radiation down right leg which is her typical symptoms, states she couldn't walk Some days took 2 pain pills at at time Still does not want surgery, epidurals and PT did not help Needs note for work due to pain No change in bowel or bladder     Review Of Systems:  GEN- denies fatigue, fever, weight loss,weakness, recent illness HEENT- denies eye drainage, change in vision, nasal discharge, CVS- denies chest pain, palpitations RESP- denies SOB, cough, wheeze ABD- denies N/V, change in stools, abd pain GU- denies dysuria, hematuria, dribbling, incontinence MSK- + joint pain, muscle aches, injury Neuro- denies headache, dizziness, syncope, seizure activity       Objective:    BP 122/64   Pulse 82   Temp 98.4 F (36.9 C) (Oral)   Resp 16   Ht 5\' 2"  (1.575 m)   Wt 245 lb (111.1 kg)   SpO2 98%   BMI 44.81 kg/m  GEN- NAD, alert and oriented x3 MSK- TTP righht parapinals, spine NT, decreased ROM, + SLR right side, mild antalgic gait Neuro- sensation in tact, normal tone LE, strength 4+/5 compared to left          Assessment & Plan:      Problem List Items Addressed This Visit      Unprioritized   Spinal stenosis of lumbar region   DDD (degenerative disc disease), lumbar - Primary    Chronic issues in which she knows her options, this is her typical "flare" mediated by pain. No red flags one exam Discussed her over dosing  of the pain medication and recommend she go to pain clinic if the percocet 10-325 is not helping and she agrees Will also try LYRICA 75mg  BID given coupon Given note for work, note they do not have FMLA           Note: This dictation was prepared with Diplomatic Services operational officer dictation along with smaller Company secretary. Any transcriptional errors that result from this process are unintentional.

## 2017-06-14 ENCOUNTER — Encounter: Payer: Self-pay | Admitting: Family Medicine

## 2017-06-14 NOTE — Assessment & Plan Note (Signed)
Chronic issues in which she knows her options, this is her typical "flare" mediated by pain. No red flags one exam Discussed her over dosing  of the pain medication and recommend she go to pain clinic if the percocet 10-325 is not helping and she agrees Will also try LYRICA 75mg  BID given coupon Given note for work, note they do not have FMLA

## 2017-06-29 ENCOUNTER — Encounter: Payer: Self-pay | Admitting: Family Medicine

## 2017-07-06 ENCOUNTER — Other Ambulatory Visit: Payer: Self-pay | Admitting: *Deleted

## 2017-07-06 DIAGNOSIS — M48061 Spinal stenosis, lumbar region without neurogenic claudication: Secondary | ICD-10-CM

## 2017-07-06 MED ORDER — OXYCODONE-ACETAMINOPHEN 10-325 MG PO TABS: 1.0000 | ORAL_TABLET | Freq: Three times a day (TID) | ORAL | 0 refills | Status: DC | PRN

## 2017-07-06 NOTE — Telephone Encounter (Signed)
Records indicate prescription refill appropriate for Oxycodone/APAP.   Ok to refill??  Last office visit 06/13/2017.  Last refill 06/04/2017.

## 2017-08-03 ENCOUNTER — Encounter: Payer: Self-pay | Admitting: Family Medicine

## 2017-08-13 ENCOUNTER — Ambulatory Visit (INDEPENDENT_AMBULATORY_CARE_PROVIDER_SITE_OTHER): Payer: BLUE CROSS/BLUE SHIELD | Admitting: Family Medicine

## 2017-08-13 ENCOUNTER — Other Ambulatory Visit: Payer: Self-pay

## 2017-08-13 ENCOUNTER — Encounter: Payer: Self-pay | Admitting: Family Medicine

## 2017-08-13 VITALS — BP 124/60 | HR 82 | Temp 98.3°F | Resp 16 | Ht 62.0 in | Wt 244.0 lb

## 2017-08-13 DIAGNOSIS — M5441 Lumbago with sciatica, right side: Secondary | ICD-10-CM

## 2017-08-13 DIAGNOSIS — I10 Essential (primary) hypertension: Secondary | ICD-10-CM | POA: Diagnosis not present

## 2017-08-13 DIAGNOSIS — G8929 Other chronic pain: Secondary | ICD-10-CM

## 2017-08-13 DIAGNOSIS — Z720 Tobacco use: Secondary | ICD-10-CM

## 2017-08-13 DIAGNOSIS — M5136 Other intervertebral disc degeneration, lumbar region: Secondary | ICD-10-CM | POA: Diagnosis not present

## 2017-08-13 MED ORDER — AMLODIPINE BESYLATE 10 MG PO TABS: 10.0000 mg | ORAL_TABLET | Freq: Every day | ORAL | 2 refills | Status: DC

## 2017-08-13 MED ORDER — MELOXICAM 7.5 MG PO TABS: 7.5000 mg | ORAL_TABLET | Freq: Every day | ORAL | 2 refills | Status: DC

## 2017-08-13 MED ORDER — NICOTINE 21 MG/24HR TD PT24: 21.0000 mg | MEDICATED_PATCH | Freq: Every day | TRANSDERMAL | 0 refills | Status: DC

## 2017-08-13 MED ORDER — PHENTERMINE HCL 37.5 MG PO TABS: ORAL_TABLET | ORAL | 2 refills | Status: DC

## 2017-08-13 NOTE — Assessment & Plan Note (Signed)
nicoderm patches for tobacco cessation

## 2017-08-13 NOTE — Assessment & Plan Note (Addendum)
Will try phentermine for another 3 months Discussed taking 1/2 tab for 1 week

## 2017-08-13 NOTE — Assessment & Plan Note (Signed)
Will check into pain referral Continued on pain medication Does not tolerate lyrica or gabapentin

## 2017-08-13 NOTE — Patient Instructions (Addendum)
I will check on pain management referral  Restart phentermine, start with 1/2 tablet then 1 full tablet  Nicoderm patches  F/U 3 months for weight

## 2017-08-13 NOTE — Assessment & Plan Note (Signed)
Blood Pressure well controlled no change in medication.

## 2017-08-13 NOTE — Progress Notes (Signed)
   Subjective:    Patient ID: Julia Avery, female    DOB: 04-Sep-1955, 62 y.o.   MRN: [id number]  Patient presents for Follow-up (is fasting)   Pt here to f/u chronic medical problems       Obesity- out of phentermine for past 1.19months, did not take during the holidays she wanted to eat, she has maintained her previous 8lbs lost would like to retry     DDD Lumbar spine, tried lyrica back in  Novmber felt "crazy". Never heard back from pain clinic , still taking pain meds as prescribed,continues to have pain on right lower back into her leg/thigh    HTN- taking BP meds as prescribed no SE   Review Of Systems:  GEN- denies fatigue, fever, weight loss,weakness, recent illness HEENT- denies eye drainage, change in vision, nasal discharge, CVS- denies chest pain, palpitations RESP- denies SOB, cough, wheeze ABD- denies N/V, change in stools, abd pain GU- denies dysuria, hematuria, dribbling, incontinence MSK-+ joint pain, muscle aches, injury Neuro- denies headache, dizziness, syncope, seizure activity       Objective:    BP 124/60   Pulse 82   Temp 98.3 F (36.8 C) (Oral)   Resp 16   Ht 5\' 2"  (1.575 m)   Wt 244 lb (110.7 kg)   SpO2 100%   BMI 44.63 kg/m  GEN- NAD, alert and oriented x3,obese HEENT- PERRL, EOMI, non injected sclera, pink conjunctiva, MMM, oropharynx clear Neck- Supple, no thyromegaly CVS- RRR, no murmur RESP-CTAB EXT- No edema Pulses- Radial 2+        Assessment & Plan:      Problem List Items Addressed This Visit      Unprioritized   DDD (degenerative disc disease), lumbar - Primary   Relevant Medications   meloxicam (MOBIC) 7.5 MG tablet   Tobacco use    nicoderm patches for tobacco cessation      Morbid obesity ([place])    Will try phentermine for another 3 months Discussed taking 1/2 tab for 1 week       Relevant Medications   phentermine (ADIPEX-P) 37.5 MG tablet   Essential hypertension, benign    Blood Pressure well  controlled no change in medication.      Relevant Medications   amLODipine (NORVASC) 10 MG tablet   Chronic back pain    Will check into pain referral Continued on pain medication Does not tolerate lyrica or gabapentin      Relevant Medications   meloxicam (MOBIC) 7.5 MG tablet      Note: This dictation was prepared with Dragon dictation along with smaller phrase technology. Any transcriptional errors that result from this process are unintentional.

## 2017-08-17 ENCOUNTER — Other Ambulatory Visit: Payer: Self-pay | Admitting: Family Medicine

## 2017-08-17 DIAGNOSIS — M48061 Spinal stenosis, lumbar region without neurogenic claudication: Secondary | ICD-10-CM

## 2017-08-17 MED ORDER — OXYCODONE-ACETAMINOPHEN 10-325 MG PO TABS: 1.0000 | ORAL_TABLET | Freq: Three times a day (TID) | ORAL | 0 refills | Status: DC | PRN

## 2017-08-17 NOTE — Telephone Encounter (Signed)
Ok to refill??  Last office visit 08/13/2017.  Last refill 07/06/2017.

## 2017-08-17 NOTE — Telephone Encounter (Signed)
Patient needs refill of her percocet sent to [facility] in [place]  [phone]

## 2017-09-12 ENCOUNTER — Ambulatory Visit: Payer: BLUE CROSS/BLUE SHIELD | Admitting: Physician Assistant

## 2017-09-12 ENCOUNTER — Encounter: Payer: Self-pay | Admitting: Physician Assistant

## 2017-09-12 VITALS — BP 118/70 | HR 87 | Temp 97.8°F | Resp 14 | Ht 62.0 in | Wt 241.4 lb

## 2017-09-12 DIAGNOSIS — L239 Allergic contact dermatitis, unspecified cause: Secondary | ICD-10-CM

## 2017-09-12 DIAGNOSIS — M48061 Spinal stenosis, lumbar region without neurogenic claudication: Secondary | ICD-10-CM | POA: Diagnosis not present

## 2017-09-12 DIAGNOSIS — M5136 Other intervertebral disc degeneration, lumbar region: Secondary | ICD-10-CM | POA: Diagnosis not present

## 2017-09-12 DIAGNOSIS — M25562 Pain in left knee: Secondary | ICD-10-CM | POA: Diagnosis not present

## 2017-09-12 DIAGNOSIS — G8929 Other chronic pain: Secondary | ICD-10-CM | POA: Diagnosis not present

## 2017-09-12 DIAGNOSIS — M25561 Pain in right knee: Secondary | ICD-10-CM

## 2017-09-12 MED ORDER — PREDNISONE 20 MG PO TABS: ORAL_TABLET | ORAL | 0 refills | Status: DC

## 2017-09-12 MED ORDER — TRIAMCINOLONE ACETONIDE 0.1 % EX CREA: 1.0000 "application " | TOPICAL_CREAM | Freq: Two times a day (BID) | CUTANEOUS | 0 refills | Status: DC

## 2017-09-12 MED ORDER — OXYCODONE-ACETAMINOPHEN 10-325 MG PO TABS: 1.0000 | ORAL_TABLET | Freq: Three times a day (TID) | ORAL | 0 refills | Status: DC | PRN

## 2017-09-12 NOTE — Progress Notes (Signed)
Patient ID: Julia Avery MRN: [id number], DOB: October 23, 1955, 62 y.o. Date of Encounter: 09/12/2017, 3:00 PM    Chief Complaint:  Chief Complaint  Patient presents with  . Rash    chest and legs  . refill on medications     HPI: 62 y.o. year old female presents with above.   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  Reports that several coworkers have similar rash and she thinks that she is getting some type of bites or something there at the group home and they are planning to have exterminated.  She states that she started noticing her rash on Sunday.  At that time developed these areas across her chest.  Now has areas of  urticaria across her chest and a couple of spots up her neck some spots on bilateral wrists.  Some spots on the right ankle and several spots on left lower leg.  These areas look like urticaria and they are itchy.  She also states that she is due for her refill on her Percocet so is needing refill on that.  She uses this for chronic back pain and knee pain.  States needs refill on her triamcinolone cream.  No other concerns to address today.     Home Meds:   Outpatient Medications Prior to Visit  Medication Sig Dispense Refill  . amLODipine (NORVASC) 10 MG tablet Take 1 tablet (10 mg total) by mouth daily. 90 tablet 2  . meloxicam (MOBIC) 7.5 MG tablet Take 1 tablet (7.5 mg total) by mouth daily. 90 tablet 2  . nicotine (NICODERM CQ) 21 mg/24hr patch Place 1 patch (21 mg total) onto the skin daily. 28 patch 0  . phentermine (ADIPEX-P) 37.5 MG tablet TAKE 1 TABLET BY MOUTH ONCE DAILY BEFORE BREAKFAST 30 tablet 2  . oxyCODONE-acetaminophen (PERCOCET) 10-325 MG tablet Take 1 tablet by mouth every 8 (eight) hours as needed for pain. 90 tablet 0  . triamcinolone cream (KENALOG) 0.1 % Apply 1 application topically 2 (two) times daily. 30 g 0   No facility-administered medications prior to visit.     Allergies:  Allergies  Allergen Reactions  .  Penicillins Other (See Comments)    Childhood reaction, never taken  . Tramadol Rash      Review of Systems: See HPI for pertinent ROS. All other ROS negative.    Physical Exam: Blood pressure 118/70, pulse 87, temperature 97.8 F (36.6 C), temperature source Oral, resp. rate 14, height 5\' 2"  (1.575 m), weight 109.5 kg (241 lb 6.4 oz), SpO2 99 %., Body mass index is 44.15 kg/m. General:  Appears in no acute distress. Neck: Supple. No thyromegaly. No lymphadenopathy. Lungs: Clear bilaterally to auscultation without wheezes, rales, or rhonchi. Breathing is unlabored. Heart: Regular rhythm. No murmurs, rubs, or gallops. Msk:  Strength and tone normal for age. Skin: She has approx 5 areas of urticaria across upper chest---each is ~ 1 inch diameter.  She has similar appearing urticaria on left side of neck.  Smaller areas of papules/ urticaria on right ankle and left lower leg.  Neuro: Alert and oriented X 3. Moves all extremities spontaneously. Gait is normal. CNII-XII grossly in tact. Psych:  Responds to questions appropriately with a normal affect.     ASSESSMENT AND PLAN:  62 y.o. year old female with  1. Allergic dermatitis Is to take Benadryl.  She is also to take prednisone taper as directed.  Told her to take this earlier in the day and  to make sure not to take in the evening.  They are going to have the group home exterminated and follow-up regarding that part of the treatment plan.  Follow-up if site on skin do not resolve with this treatment. - predniSONE (DELTASONE) 20 MG tablet; Take 3 daily for 2 days, then 2 daily for 2 days, then 1 daily for 2 days.  Dispense: 12 tablet; Refill: 0  2. DDD (degenerative disc disease), lumbar  3. Chronic pain of both knees  4. Spinal stenosis of lumbar region, unspecified whether neurogenic claudication present - oxyCODONE-acetaminophen (PERCOCET) 10-325 MG tablet; Take 1 tablet by mouth every 8 (eight) hours as needed for pain.   Dispense: 90 tablet; Refill: 0   Signed, [address] [place], [place], [facility] 09/12/2017 3:00 PM

## 2017-09-19 ENCOUNTER — Encounter: Payer: Self-pay | Admitting: Family Medicine

## 2017-10-11 ENCOUNTER — Other Ambulatory Visit: Payer: Self-pay | Admitting: *Deleted

## 2017-10-11 DIAGNOSIS — M48061 Spinal stenosis, lumbar region without neurogenic claudication: Secondary | ICD-10-CM

## 2017-10-11 NOTE — Telephone Encounter (Signed)
Received call from patient.   Requested refill on Oxycodone/APAP.   Ok to refill??  Last office visit/ refill 09/12/2017.

## 2017-10-12 MED ORDER — OXYCODONE-ACETAMINOPHEN 10-325 MG PO TABS: 1.0000 | ORAL_TABLET | Freq: Three times a day (TID) | ORAL | 0 refills | Status: DC | PRN

## 2017-10-22 ENCOUNTER — Encounter: Payer: Self-pay | Admitting: Family Medicine

## 2017-11-12 ENCOUNTER — Ambulatory Visit: Payer: BLUE CROSS/BLUE SHIELD | Admitting: Family Medicine

## 2017-11-13 ENCOUNTER — Ambulatory Visit: Payer: BLUE CROSS/BLUE SHIELD | Admitting: Family Medicine

## 2018-02-04 ENCOUNTER — Encounter: Payer: Self-pay | Admitting: Family Medicine

## 2018-03-20 ENCOUNTER — Ambulatory Visit: Payer: BLUE CROSS/BLUE SHIELD | Admitting: Family Medicine

## 2018-03-20 ENCOUNTER — Encounter: Payer: Self-pay | Admitting: Physician Assistant

## 2018-03-20 ENCOUNTER — Ambulatory Visit (INDEPENDENT_AMBULATORY_CARE_PROVIDER_SITE_OTHER): Payer: PRIVATE HEALTH INSURANCE | Admitting: Physician Assistant

## 2018-03-20 VITALS — BP 118/82 | HR 60 | Temp 97.9°F | Resp 18 | Ht 62.0 in | Wt 240.2 lb

## 2018-03-20 DIAGNOSIS — M48061 Spinal stenosis, lumbar region without neurogenic claudication: Secondary | ICD-10-CM | POA: Diagnosis not present

## 2018-03-20 DIAGNOSIS — G8929 Other chronic pain: Secondary | ICD-10-CM | POA: Diagnosis not present

## 2018-03-20 DIAGNOSIS — M549 Dorsalgia, unspecified: Secondary | ICD-10-CM | POA: Diagnosis not present

## 2018-03-20 MED ORDER — TRIAMCINOLONE ACETONIDE 0.1 % EX CREA: 1.0000 "application " | TOPICAL_CREAM | Freq: Two times a day (BID) | CUTANEOUS | 0 refills | Status: DC

## 2018-03-20 NOTE — Progress Notes (Signed)
Patient ID: Julia Avery MRN: [id number], DOB: 1956-02-05, 62 y.o. Date of Encounter: 03/20/2018, 11:36 AM    Chief Complaint:  Chief Complaint  Patient presents with  . discuss getting back surgery     HPI: 62 y.o. year old female presents with above.   She states that she has been seeing Dr. At "[facility]. " She "has had injection 2 or 3 times."  Says that "after that,  the doctor at pain management said "it is time for you to see the surgeon ".  Says that they have put in a referral for her to see a doctor in [place].  She is waiting for that phone call.  However says that she does not "want to do anything until she talks to Dr. Buelah Manis ".  States that she does not think she wants to go through with surgery. Says that in the past Dr. Buelah Manis talked to her about possibly getting disability.  States that she is now age 62 and that she was hoping she could get through another 2 years and then retire at 21.  However now that pain management is out of options and is saying for her to do surgery,  she is considering disability--- but says that she "will not do anything until she talks to Dr. Buelah Manis.  "  Says that she did not realize until now that Dr. Buelah Manis is out on maternity leave.  Says that she will follow-up with Dr. Buelah Manis once she returns from maternity leave.  She is requesting me to give her a hard copy prescription for Percocet. However I reviewed her chart and reviewed that she needs to continue to get any pain medication from the pain clinic.   She also is asking if she can have a refill on the Adipex.  However I reviewed that she was given this prescription 08/13/2017 with refills.  Discussed that it is not safe for her to use this on a regular basis.  Is also wanting a refill on triamcinolone cream and I have sent in that refill.  Otherwise she says that she will just wait for Dr. Buelah Manis to come back to discuss her other issues including pain management  and back pain.     Home Meds:   Outpatient Medications Prior to Visit  Medication Sig Dispense Refill  . amLODipine (NORVASC) 10 MG tablet Take 1 tablet (10 mg total) by mouth daily. 90 tablet 2  . meloxicam (MOBIC) 7.5 MG tablet Take 1 tablet (7.5 mg total) by mouth daily. 90 tablet 2  . nicotine (NICODERM CQ) 21 mg/24hr patch Place 1 patch (21 mg total) onto the skin daily. 28 patch 0  . oxyCODONE-acetaminophen (PERCOCET) 10-325 MG tablet Take 1 tablet by mouth every 8 (eight) hours as needed for pain. 90 tablet 0  . phentermine (ADIPEX-P) 37.5 MG tablet TAKE 1 TABLET BY MOUTH ONCE DAILY BEFORE BREAKFAST 30 tablet 2  . triamcinolone cream (KENALOG) 0.1 % Apply 1 application topically 2 (two) times daily. 30 g 0  . predniSONE (DELTASONE) 20 MG tablet Take 3 daily for 2 days, then 2 daily for 2 days, then 1 daily for 2 days. 12 tablet 0   No facility-administered medications prior to visit.     Allergies:  Allergies  Allergen Reactions  . Penicillins Other (See Comments)    Childhood reaction, never taken  . Tramadol Rash      Review of Systems: See HPI for pertinent ROS. All other ROS negative.  Physical Exam: Blood pressure 118/82, pulse 60, temperature 97.9 F (36.6 C), temperature source Oral, resp. rate 18, height 5\' 2"  (1.575 m), weight 109 kg, SpO2 99 %., Body mass index is 43.93 kg/m. General:  Obese AAF. Appears in no acute distress. Neck: Supple. No thyromegaly. No lymphadenopathy. Lungs: Clear bilaterally to auscultation without wheezes, rales, or rhonchi. Breathing is unlabored. Heart: Regular rhythm. No murmurs, rubs, or gallops. Msk:  Strength and tone normal for age. Extremities/Skin: Warm and dry.  Neuro: Alert and oriented X 3. Moves all extremities spontaneously.  CNII-XII grossly in tact. Psych:  Responds to questions appropriately with a normal affect.     ASSESSMENT AND PLAN:  62 y.o. year old female with   1. Other chronic back pain She is to  continue with pain management.  She will follow-up with Dr. Awanda Mink once she returns from maternity leave.  2. Spinal stenosis of lumbar region, unspecified whether neurogenic claudication present She is to continue with pain management.  She will follow-up with Dr. Awanda Mink once she returns from maternity leave.   3. Morbid obesity ([place]) She does need weight loss but it is not safe to continue using Adipex so no prescription given for this today.   Signed, [address] [place], [place], [facility] 03/20/2018 11:36 AM

## 2018-06-05 ENCOUNTER — Ambulatory Visit: Payer: PRIVATE HEALTH INSURANCE | Admitting: Family Medicine

## 2018-06-19 ENCOUNTER — Ambulatory Visit: Payer: BLUE CROSS/BLUE SHIELD | Admitting: Family Medicine

## 2018-06-19 ENCOUNTER — Other Ambulatory Visit: Payer: Self-pay

## 2018-06-19 ENCOUNTER — Encounter: Payer: Self-pay | Admitting: Family Medicine

## 2018-06-19 DIAGNOSIS — I10 Essential (primary) hypertension: Secondary | ICD-10-CM

## 2018-06-19 DIAGNOSIS — M549 Dorsalgia, unspecified: Secondary | ICD-10-CM

## 2018-06-19 DIAGNOSIS — G8929 Other chronic pain: Secondary | ICD-10-CM

## 2018-06-19 DIAGNOSIS — M48061 Spinal stenosis, lumbar region without neurogenic claudication: Secondary | ICD-10-CM | POA: Diagnosis not present

## 2018-06-19 DIAGNOSIS — M5136 Other intervertebral disc degeneration, lumbar region: Secondary | ICD-10-CM | POA: Diagnosis not present

## 2018-06-19 MED ORDER — MELOXICAM 15 MG PO TABS: 15.0000 mg | ORAL_TABLET | Freq: Every day | ORAL | 2 refills | Status: DC

## 2018-06-19 MED ORDER — OXYCODONE-ACETAMINOPHEN 10-325 MG PO TABS: 1.0000 | ORAL_TABLET | Freq: Three times a day (TID) | ORAL | 0 refills | Status: DC | PRN

## 2018-06-19 MED ORDER — AMLODIPINE BESYLATE 10 MG PO TABS: 10.0000 mg | ORAL_TABLET | Freq: Every day | ORAL | 2 refills | Status: DC

## 2018-06-19 NOTE — Assessment & Plan Note (Signed)
Discussed her diet, poor choices why weight has not decrease, I do not think phentermine should be tried again she was not consistent with diet or weight loss program the last time She declines saxenda

## 2018-06-19 NOTE — Assessment & Plan Note (Signed)
Increase mobic to 15mg  once a day  Regarding MRI/ referral she is going to call her pain clinic and verify if they have set up appt and MRI with her new insurance, if this is an issue my office will order MRI lumbar spine w/o contrast and then refer to Dr. Carloyn Manner in [place] Pain meds per pain clinic

## 2018-06-19 NOTE — Patient Instructions (Addendum)
Call me back today about the MRI and referral to Dr. Carloyn Manner  Mobic increased to 15mg   Continue blood pressure medication Release of records of [facility]  F/U 4 MONTHS for Physical

## 2018-06-19 NOTE — Progress Notes (Signed)
Subjective:    Patient ID: Julia Avery, female    DOB: [birth year], 62 y.o.   MRN: [id number]  Patient presents for Back pain (would like to increase Mobic and needs MRI for back surgery- now has insurance); Weight Gain (would like to resume Phentermine); and Disability (states that she would like to disuss starting disability for DDD)   Pt here to discuss proceeding with back surgery. She has chronic DDD of lumbar spine with radiating symptoms into her Right leg, since an accident that occurred at that a [place]  In  2015 she worked at a few years ago.  Her last MRI in March 2017:  IMPRESSION: 1. Multilevel spondylosis of the lumbar spine as described. 2. Broad-based disc protrusion with moderate left subarticular and foraminal stenosis at L1-2. 3. Mild subarticular and foraminal stenosis bilaterally at L2-3. 4. Mild subarticular and moderate foraminal stenosis bilaterally at L3-4 is worse on the right. 5. Moderate subarticular and mild foraminal narrowing bilaterally at L4-5 is also worse on the right. 6. Asymmetric right-sided facet hypertrophy and mild central disc protrusion at L5-S1 with mild foraminal stenosis bilaterally.   She has completd PT, she has had epidural shots, has been seen by  Dr. Cyndy Freeze Neurosurgery in  2017  She was following with [place] old insurance would not cover a local MRI and had very high deductable, Dr., Greta Doom had scheduled her for MRI and wanted to refer her to Dr. Carloyn Manner  , she now has new insurance, states MRI did not go through last month She would like to increase her mobic to 15mg    Obesity has been on phentermine in the past, last given scripts in Jan, ut no significant weight loss   HTN- taking norvasc as prescribed    Review Of Systems:  GEN- denies fatigue, fever, weight loss,weakness, recent illness HEENT- denies eye drainage, change in vision, nasal discharge, CVS- denies chest pain, palpitations RESP-  denies SOB, cough, wheeze ABD- denies N/V, change in stools, abd pain GU- denies dysuria, hematuria, dribbling, incontinence MSK- + joint pain, muscle aches, injury Neuro- denies headache, dizziness, syncope, seizure activity       Objective:    BP 128/66   Pulse 82   Temp 98.4 F (36.9 C) (Oral)   Resp 14   Ht 5\' 2"  (1.575 m)   Wt 242 lb (109.8 kg)   SpO2 97%   BMI 44.26 kg/m  GEN- NAD, alert and oriented x3 HEENT- PERRL, EOMI, non injected sclera, pink conjunctiva, MMM, oropharynx clear Neck- Supple, no thyromegaly CVS- RRR, no murmur RESP-CTAB MSK- decreased ROM lumbar spine, strength decreased RLE compared to left  antalgic gait Neuro- normal tone LE, sensation in tact EXT- No edema Pulses- Radial  2+        Assessment & Plan:      Problem List Items Addressed This Visit      Unprioritized   Chronic back pain    Increase mobic to 15mg  once a day  Regarding MRI/ referral she is going to call her pain clinic and verify if they have set up appt and MRI with her new insurance, if this is an issue my office will order MRI lumbar spine w/o contrast and then refer to Dr. Carloyn Manner in [place] Pain meds per pain clinic      Relevant Medications   oxyCODONE-acetaminophen (PERCOCET) 10-325 MG tablet   meloxicam (MOBIC) 15 MG tablet   DDD (degenerative disc disease), lumbar   Relevant Medications  oxyCODONE-acetaminophen (PERCOCET) 10-325 MG tablet   meloxicam (MOBIC) 15 MG tablet   Essential hypertension, benign    Controlled no changes to norvasc      Relevant Medications   amLODipine (NORVASC) 10 MG tablet   Other Relevant Orders   CBC with Differential/Platelet   Comprehensive metabolic panel   Lipid panel   Morbid obesity ([place]) - Primary    Discussed her diet, poor choices why weight has not decrease, I do not think phentermine should be tried again she was not consistent with diet or weight loss program the last time She declines saxenda      Relevant  Orders   Lipid panel   Spinal stenosis of lumbar region   Relevant Medications   oxyCODONE-acetaminophen (PERCOCET) 10-325 MG tablet      Note: This dictation was prepared with Dragon dictation along with smaller phrase technology. Any transcriptional errors that result from this process are unintentional.

## 2018-06-19 NOTE — Assessment & Plan Note (Signed)
Controlled no changes to norvasc  

## 2018-06-20 LAB — COMPREHENSIVE METABOLIC PANEL
AG Ratio: 1.6 (calc) (ref 1.0–2.5)
ALT: 18 U/L (ref 6–29)
AST: 17 U/L (ref 10–35)
Albumin: 4.5 g/dL (ref 3.6–5.1)
Alkaline phosphatase (APISO): 95 U/L (ref 33–130)
BUN: 14 mg/dL (ref 7–25)
CO2: 30 mmol/L (ref 20–32)
Calcium: 9.7 mg/dL (ref 8.6–10.4)
Chloride: 105 mmol/L (ref 98–110)
Creat: 0.66 mg/dL (ref 0.50–0.99)
Globulin: 2.8 g/dL (calc) (ref 1.9–3.7)
Glucose, Bld: 104 mg/dL — ABNORMAL HIGH (ref 65–99)
Potassium: 3.8 mmol/L (ref 3.5–5.3)
Sodium: 144 mmol/L (ref 135–146)
Total Bilirubin: 0.3 mg/dL (ref 0.2–1.2)
Total Protein: 7.3 g/dL (ref 6.1–8.1)

## 2018-06-20 LAB — CBC WITH DIFFERENTIAL/PLATELET
Basophils Absolute: 92 cells/uL (ref 0–200)
Basophils Relative: 1.3 %
Eosinophils Absolute: 348 cells/uL (ref 15–500)
Eosinophils Relative: 4.9 %
HCT: 38.1 % (ref 35.0–45.0)
Hemoglobin: 12.7 g/dL (ref 11.7–15.5)
Lymphs Abs: 2911 cells/uL (ref 850–3900)
MCH: 29.7 pg (ref 27.0–33.0)
MCHC: 33.3 g/dL (ref 32.0–36.0)
MCV: 89 fL (ref 80.0–100.0)
MPV: 10.1 fL (ref 7.5–12.5)
Monocytes Relative: 7.7 %
Neutro Abs: 3202 cells/uL (ref 1500–7800)
Neutrophils Relative %: 45.1 %
Platelets: 429 10*3/uL — ABNORMAL HIGH (ref 140–400)
RBC: 4.28 10*6/uL (ref 3.80–5.10)
RDW: 14.4 % (ref 11.0–15.0)
Total Lymphocyte: 41 %
WBC mixed population: 547 cells/uL (ref 200–950)
WBC: 7.1 10*3/uL (ref 3.8–10.8)

## 2018-06-20 LAB — LIPID PANEL
Cholesterol: 208 mg/dL — ABNORMAL HIGH (ref <200)
HDL: 84 mg/dL (ref >50)
LDL Cholesterol (Calc): 110 mg/dL (calc) — ABNORMAL HIGH
Non-HDL Cholesterol (Calc): 124 mg/dL (calc) (ref <130)
Total CHOL/HDL Ratio: 2.5 (calc) (ref <5.0)
Triglycerides: 49 mg/dL (ref <150)

## 2018-06-21 ENCOUNTER — Encounter: Payer: Self-pay | Admitting: *Deleted

## 2018-06-26 ENCOUNTER — Encounter: Payer: Self-pay | Admitting: Family Medicine

## 2018-07-04 ENCOUNTER — Other Ambulatory Visit: Payer: Self-pay | Admitting: Physical Medicine and Rehabilitation

## 2018-07-04 DIAGNOSIS — M48061 Spinal stenosis, lumbar region without neurogenic claudication: Secondary | ICD-10-CM

## 2018-07-04 DIAGNOSIS — M4807 Spinal stenosis, lumbosacral region: Secondary | ICD-10-CM

## 2018-07-20 ENCOUNTER — Ambulatory Visit
Admission: RE | Admit: 2018-07-20 | Discharge: 2018-07-20 | Disposition: A | Discharge status: Home / Self Care | Payer: Self-pay | Source: Ambulatory Visit | Attending: Physical Medicine and Rehabilitation | Admitting: Physical Medicine and Rehabilitation

## 2018-07-20 DIAGNOSIS — M4807 Spinal stenosis, lumbosacral region: Secondary | ICD-10-CM

## 2018-08-27 ENCOUNTER — Other Ambulatory Visit (HOSPITAL_BASED_OUTPATIENT_CLINIC_OR_DEPARTMENT_OTHER): Payer: Self-pay

## 2018-08-27 DIAGNOSIS — G4733 Obstructive sleep apnea (adult) (pediatric): Secondary | ICD-10-CM

## 2018-08-27 DIAGNOSIS — G4700 Insomnia, unspecified: Secondary | ICD-10-CM

## 2018-09-05 ENCOUNTER — Ambulatory Visit (INDEPENDENT_AMBULATORY_CARE_PROVIDER_SITE_OTHER): Payer: BLUE CROSS/BLUE SHIELD

## 2018-09-05 ENCOUNTER — Ambulatory Visit (INDEPENDENT_AMBULATORY_CARE_PROVIDER_SITE_OTHER): Payer: BLUE CROSS/BLUE SHIELD | Admitting: Orthopaedic Surgery

## 2018-09-05 ENCOUNTER — Encounter (INDEPENDENT_AMBULATORY_CARE_PROVIDER_SITE_OTHER): Payer: Self-pay | Admitting: Orthopaedic Surgery

## 2018-09-05 VITALS — BP 131/84 | HR 78 | Ht 63.0 in | Wt 240.0 lb

## 2018-09-05 DIAGNOSIS — M1611 Unilateral primary osteoarthritis, right hip: Secondary | ICD-10-CM

## 2018-09-05 DIAGNOSIS — M25551 Pain in right hip: Secondary | ICD-10-CM | POA: Diagnosis not present
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
## 2018-09-06 ENCOUNTER — Encounter (INDEPENDENT_AMBULATORY_CARE_PROVIDER_SITE_OTHER): Payer: Self-pay | Admitting: Orthopaedic Surgery

## 2018-09-06 DIAGNOSIS — M1611 Unilateral primary osteoarthritis, right hip: Secondary | ICD-10-CM | POA: Insufficient documentation

## 2018-09-09 ENCOUNTER — Other Ambulatory Visit: Payer: Self-pay | Admitting: *Deleted

## 2018-09-09 MED ORDER — BENZONATATE 200 MG PO CAPS: 200.0000 mg | ORAL_CAPSULE | Freq: Two times a day (BID) | ORAL | 0 refills | Status: DC | PRN

## 2018-09-09 NOTE — Telephone Encounter (Signed)
Received call from patient.   Reports that she is x4 days in on non-productive cough. States that she has no other Sx. Denies fever, nasal drainage, etc.   Reports that cough comes and goes, but is worse at night.   Requested refill on Tessalon.   Prescription sent to pharmacy.

## 2018-10-03 ENCOUNTER — Ambulatory Visit (INDEPENDENT_AMBULATORY_CARE_PROVIDER_SITE_OTHER): Payer: BLUE CROSS/BLUE SHIELD | Admitting: Surgery

## 2018-10-04 ENCOUNTER — Inpatient Hospital Stay (HOSPITAL_COMMUNITY): Admission: RE | Admit: 2018-10-04 | Payer: Self-pay | Source: Ambulatory Visit

## 2018-10-09 ENCOUNTER — Other Ambulatory Visit: Payer: Self-pay

## 2018-10-09 ENCOUNTER — Ambulatory Visit (INDEPENDENT_AMBULATORY_CARE_PROVIDER_SITE_OTHER): Payer: BLUE CROSS/BLUE SHIELD | Admitting: Family Medicine

## 2018-10-09 ENCOUNTER — Telehealth: Payer: Self-pay | Admitting: *Deleted

## 2018-10-09 ENCOUNTER — Encounter: Payer: Self-pay | Admitting: Family Medicine

## 2018-10-09 DIAGNOSIS — J209 Acute bronchitis, unspecified: Secondary | ICD-10-CM | POA: Diagnosis not present

## 2018-10-09 MED ORDER — ALBUTEROL SULFATE HFA 108 (90 BASE) MCG/ACT IN AERS: 2.0000 | INHALATION_SPRAY | RESPIRATORY_TRACT | 0 refills | Status: DC | PRN

## 2018-10-09 MED ORDER — AZITHROMYCIN 250 MG PO TABS: ORAL_TABLET | ORAL | 0 refills | Status: DC

## 2018-10-09 MED ORDER — PREDNISONE 20 MG PO TABS: 20.0000 mg | ORAL_TABLET | Freq: Every day | ORAL | 0 refills | Status: DC

## 2018-10-09 NOTE — Telephone Encounter (Signed)
Received call from patient. [phone]~ telephone.   Reports that she had cough x2 weeks. Had requested refill on Tessalon on 09/09/2018. States that she has been taking it as needed.    States that she no longer has cough, but continues to have chest congestion. Denies fever, SOB. States that she does have slight wheeze.   Requested MD to advise.   Of note, she is agreeable to phone visit if provider thinks appropriate.

## 2018-10-09 NOTE — Telephone Encounter (Signed)
Place on schedule for telephone visit

## 2018-10-09 NOTE — Progress Notes (Signed)
Virtual Visit via Telephone Note  I connected with Julia Avery on 10/09/18 at 3:17 pm by telephone and verified that I am speaking with the correct person using two identifiers.   Pt location: Home Physician Location: Vic Blackbird MD, [facility]  I discussed the limitations, risks, security and privacy concerns of performing an evaluation and management service by telephone and the availability of in person appointments. I also discussed with the patient that there may be a patient responsible charge related to this service. The patient expressed understanding and agreed to proceed.   History of Present Illness:  Cough with congestion 2 weeks ago.  She did not have any fever.  She has not had any travel she has not been exposed to [place] virus that she is aware of.  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  Her cough is been improving however she does get very minimal production but still has rattling and wheezing in her chest throughout the day.  She denies any difficulty breathing per se but does feel tight on occasion.  She has tried over-the-counter Mucinex, Sudafed and Tessalon Perles which actually caused her to have some nausea.  She denies any GI symptoms such as vomiting or diarrhea.  She denies any sinus pressure or drainage.  Medications were reviewed  Mucinex, sudafed,tessalon Observations/Objective: No audible wheeze she did have mild cough.  Speaking in full sentences no apparent difficulty breathing over the phone.  Assessment and Plan: Bronchitis with wheezing.  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  She often stays a couple weeks at a time for her job.  Follow Up Instructions:    I discussed the assessment and treatment plan with the patient. The patient was provided an opportunity to ask questions and all were answered.  The patient agreed with the plan and demonstrated an understanding of the instructions.   The patient was advised to call back or seek an in-person evaluation if the symptoms worsen or if the condition fails to improve as anticipated.  I provided 5  minutes of non-face-to-face time during this encounter.  End Time 3:21pm   Vic Blackbird, MD

## 2018-10-11 ENCOUNTER — Ambulatory Visit (HOSPITAL_COMMUNITY)
Admission: RE | Admit: 2018-10-11 | Payer: BLUE CROSS/BLUE SHIELD | Source: Home / Self Care | Admitting: Orthopaedic Surgery

## 2018-10-11 ENCOUNTER — Encounter (HOSPITAL_COMMUNITY): Admission: RE | Payer: Self-pay | Source: Home / Self Care

## 2018-10-11 SURGERY — ARTHROPLASTY, HIP, TOTAL, ANTERIOR APPROACH
Anesthesia: General | Laterality: Right

## 2018-10-21 ENCOUNTER — Encounter: Payer: PRIVATE HEALTH INSURANCE | Admitting: Family Medicine

## 2018-12-04 ENCOUNTER — Ambulatory Visit: Payer: BLUE CROSS/BLUE SHIELD | Admitting: Family Medicine

## 2018-12-04 ENCOUNTER — Other Ambulatory Visit: Payer: Self-pay

## 2018-12-04 ENCOUNTER — Encounter: Payer: Self-pay | Admitting: Family Medicine

## 2018-12-04 VITALS — BP 122/70 | HR 68 | Temp 98.3°F | Resp 14 | Ht 63.0 in | Wt 239.0 lb

## 2018-12-04 DIAGNOSIS — Z720 Tobacco use: Secondary | ICD-10-CM | POA: Diagnosis not present

## 2018-12-04 DIAGNOSIS — I10 Essential (primary) hypertension: Secondary | ICD-10-CM

## 2018-12-04 DIAGNOSIS — R3915 Urgency of urination: Secondary | ICD-10-CM

## 2018-12-04 DIAGNOSIS — Z01818 Encounter for other preprocedural examination: Secondary | ICD-10-CM | POA: Diagnosis not present

## 2018-12-04 DIAGNOSIS — M1611 Unilateral primary osteoarthritis, right hip: Secondary | ICD-10-CM

## 2018-12-04 MED ORDER — NICOTINE 14 MG/24HR TD PT24: 14.0000 mg | MEDICATED_PATCH | Freq: Every day | TRANSDERMAL | 0 refills | Status: DC

## 2018-12-04 NOTE — Patient Instructions (Addendum)
Use nicoderm patch daily- DO NOT SMOKE with the patch on  You are cleared for surgery  F/U Sept/Oct for Physical

## 2018-12-04 NOTE — Assessment & Plan Note (Signed)
Blood pressures well controlled.  She will have preoperative labs done along with COVID testing at the surgery center.  Her EKG is unremarkable she is cleared to have surgical intervention for her hip.  With regard to the tobacco use we will try him on NicoDerm 14 mg patch.  Regarding her urinary urgency this is a chronic problem for her.  Prefer not to start her on any type of anticholinergic as she does get some intermittent constipation and will have surgery this may lead to increased risk of ileus after surgery.  We may consider trying Myrbetriq or another medication afterwards.

## 2018-12-04 NOTE — Progress Notes (Signed)
   Subjective:    Patient ID: Julia Avery, female    DOB: 04-11-1956, 63 y.o.   MRN: [id number]  Patient presents for Surgical Clearance (Hip replacement- Dr. Lorin Mercy, [place])  Planning for Right total hip [facility]th [facility]. Planning for Rehab at home 6-8 weeks, Son coming from [place] 10-325MG  QID by [facility]   Meloxicam  HTN- novasc 10mg  daily , no problems with the medication   She has used albuterol in the middle of the night a few times. Continues to smoke.   Denies SOB, chest pain, sometimes feels tight Would nicotine patches would like to cut down her cigarettes before her surgery  Constipation sometimes- taking stool softner   Normal movements   She does get some incontinence- gets urgency to uriante if she cant get to bathroom , she cant hold it may leak.  She is actually had this for a few years.  Dysuria or abdominal discomfort    Review Of Systems:  GEN- denies fatigue, fever, weight loss,weakness, recent illness HEENT- denies eye drainage, change in vision, nasal discharge, CVS- denies chest pain, palpitations RESP- denies SOB, cough, wheeze ABD- denies N/V, change in stools, abd pain GU- denies dysuria, hematuria, dribbling, incontinence MSK- denies joint pain, muscle aches, injury Neuro- denies headache, dizziness, syncope, seizure activity       Objective:    BP 122/70   Pulse 68   Temp 98.3 F (36.8 C) (Oral)   Resp 14   Ht 5\' 3"  (1.6 m)   Wt 239 lb (108.4 kg)   BMI 42.34 kg/m  GEN- NAD, alert and oriented x3 HEENT- PERRL, EOMI, non injected sclera, pink conjunctiva, MMM, oropharynx clear Neck- Supple, no thyromegaly CVS- RRR, no murmur RESP-CTAB ABD-NABS,soft,NT,ND EXT- No edema Pulses- Radial, DP- 2+   EKG normal sinus rhythm mild T wave flattening in the lateral leads     Assessment & Plan:      Problem List Items Addressed This Visit      Unprioritized   Essential hypertension,  benign - Primary    Blood pressures well controlled.  She will have preoperative labs done along with COVID testing at the surgery center.  Her EKG is unremarkable she is cleared to have surgical intervention for her hip.  With regard to the tobacco use we will try him on NicoDerm 14 mg patch.  Regarding her urinary urgency this is a chronic problem for her.  Prefer not to start her on any type of anticholinergic as she does get some intermittent constipation and will have surgery this may lead to increased risk of ileus after surgery.  We may consider trying Myrbetriq or another medication afterwards.      Relevant Orders   EKG 12-Lead (Completed)   Tobacco use   Unilateral primary osteoarthritis, right hip   Urinary urgency    Other Visit Diagnoses    Preoperative clearance       Relevant Orders   EKG 12-Lead (Completed)      Note: This dictation was prepared with Dragon dictation along with smaller phrase technology. Any transcriptional errors that result from this process are unintentional.

## 2018-12-05 ENCOUNTER — Other Ambulatory Visit: Payer: Self-pay

## 2018-12-19 ENCOUNTER — Ambulatory Visit (INDEPENDENT_AMBULATORY_CARE_PROVIDER_SITE_OTHER): Payer: BC Managed Care – PPO | Admitting: Surgery

## 2018-12-19 ENCOUNTER — Other Ambulatory Visit: Payer: Self-pay

## 2018-12-19 ENCOUNTER — Encounter: Payer: Self-pay | Admitting: Surgery

## 2018-12-19 VITALS — BP 134/86 | HR 86 | Ht 63.0 in | Wt 237.0 lb

## 2018-12-19 DIAGNOSIS — M25551 Pain in right hip: Secondary | ICD-10-CM

## 2018-12-19 DIAGNOSIS — M1611 Unilateral primary osteoarthritis, right hip: Secondary | ICD-10-CM

## 2018-12-19 NOTE — Progress Notes (Signed)
63 year old white female history of end-stage DJD right hip comes in for preop evaluation.  Symptoms unchanged from previous visit.  She is want to proceed with total hip replacement as scheduled.  Today full history physical performed.  We have received preop medical clearance.  All questions answered.

## 2018-12-26 ENCOUNTER — Other Ambulatory Visit: Payer: Self-pay

## 2018-12-26 ENCOUNTER — Ambulatory Visit (HOSPITAL_COMMUNITY)
Admission: RE | Admit: 2018-12-26 | Discharge: 2018-12-26 | Disposition: A | Discharge status: Home / Self Care | Payer: BC Managed Care – PPO | Source: Ambulatory Visit | Attending: Surgery | Admitting: Surgery

## 2018-12-26 ENCOUNTER — Encounter (HOSPITAL_COMMUNITY)
Admission: RE | Admit: 2018-12-26 | Discharge: 2018-12-26 | Disposition: A | Discharge status: Home / Self Care | Payer: BC Managed Care – PPO | Source: Ambulatory Visit | Attending: Orthopaedic Surgery | Admitting: Orthopaedic Surgery

## 2018-12-26 ENCOUNTER — Other Ambulatory Visit (HOSPITAL_COMMUNITY)
Admission: RE | Admit: 2018-12-26 | Discharge: 2018-12-26 | Disposition: A | Discharge status: Home / Self Care | Payer: BC Managed Care – PPO | Source: Ambulatory Visit | Attending: Orthopaedic Surgery | Admitting: Orthopaedic Surgery

## 2018-12-26 DIAGNOSIS — Z01818 Encounter for other preprocedural examination: Secondary | ICD-10-CM | POA: Insufficient documentation

## 2018-12-26 DIAGNOSIS — Z1159 Encounter for screening for other viral diseases: Secondary | ICD-10-CM | POA: Diagnosis not present

## 2018-12-26 LAB — CBC
HCT: 41 % (ref 36.0–46.0)
Hemoglobin: 13.2 g/dL (ref 12.0–15.0)
MCH: 29.6 pg (ref 26.0–34.0)
MCHC: 32.2 g/dL (ref 30.0–36.0)
MCV: 91.9 fL (ref 80.0–100.0)
Platelets: 420 10*3/uL — ABNORMAL HIGH (ref 150–400)
RBC: 4.46 MIL/uL (ref 3.87–5.11)
RDW: 15.9 % — ABNORMAL HIGH (ref 11.5–15.5)
WBC: 7.6 10*3/uL (ref 4.0–10.5)
nRBC: 0 % (ref 0.0–0.2)

## 2018-12-26 LAB — URINALYSIS, ROUTINE W REFLEX MICROSCOPIC
Bilirubin Urine: NEGATIVE
Glucose, UA: NEGATIVE mg/dL
Hgb urine dipstick: NEGATIVE
Ketones, ur: 5 mg/dL — AB
Leukocytes,Ua: NEGATIVE
Nitrite: NEGATIVE
Protein, ur: NEGATIVE mg/dL
Specific Gravity, Urine: 1.03 (ref 1.005–1.030)
pH: 5 (ref 5.0–8.0)

## 2018-12-26 LAB — COMPREHENSIVE METABOLIC PANEL
ALT: 40 U/L (ref 0–44)
AST: 27 U/L (ref 15–41)
Albumin: 4.3 g/dL (ref 3.5–5.0)
Alkaline Phosphatase: 84 U/L (ref 38–126)
Anion gap: 9 (ref 5–15)
BUN: 14 mg/dL (ref 6–20)
CO2: 24 mmol/L (ref 22–32)
Calcium: 9.4 mg/dL (ref 8.9–10.3)
Chloride: 108 mmol/L (ref 98–111)
Creatinine, Ser: 0.6 mg/dL (ref 0.44–1.00)
GFR calc Af Amer: >60 mL/min (ref >60)
GFR calc non Af Amer: >60 mL/min (ref >60)
Glucose, Bld: 88 mg/dL (ref 70–99)
Potassium: 3.5 mmol/L (ref 3.5–5.1)
Sodium: 141 mmol/L (ref 135–145)
Total Bilirubin: 0.3 mg/dL (ref 0.3–1.2)
Total Protein: 7.6 g/dL (ref 6.5–8.1)

## 2018-12-26 LAB — SURGICAL PCR SCREEN
MRSA, PCR: NEGATIVE
Staphylococcus aureus: NEGATIVE
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
## 2018-12-26 NOTE — Pre-Procedure Instructions (Signed)
Juliyah Mccollum-Williams  12/26/2018      [facility] - [place], [place] [address] Phone: [phone] Fax: [phone]    Your procedure is scheduled on Monday 12-30-2018  Report to [facility] Admitting at 11:40 A.M.   Call this number if you have problems the morning of surgery:  [phone]   Remember:  Do not eat food or drink liquids after midnight.                           Take these medicines the morning of surgery with A SIP OF WATER   Albuterol inhaler if needed,bring inhaler with you day of surgery Amlodipine(Norvasc) Oxycodone(Percocet if needed) Stool Softener   STOP TAKING ANY ASPIRIN (UNLESS OTHERWISE INSTRUCTED BY YOUR SURGEON),ANTIINFLAMATORIES (IBUPROFEN,ALEVE,MOTRIN,ADVIL,GOODY'S POWDERS),HERBAL SUPPLEMENTS,FISH OIL,AND VITAMINS 5-7 DAYS PRIOR TO SURGERY      Do not wear jewelry, make-up or nail polish.  Do not wear lotions, powders, or perfumes, or deodorant.  Do not shave 48 hours prior to surgery.  Do not bring valuables to the hospital.  [facility] is not responsible for any belongings or valuables.  Contacts, dentures or bridgework may not be worn into surgery. .  For patients admitted to the hospital, discharge time will be determined by your treatment team.  Patients discharged the day of surgery will not be allowed to drive home.     [facility] - Preparing for Surgery  Before surgery, you can play an important role.  Because skin is not sterile, your skin needs to be as free of germs as possible.  You can reduce the number of germs on you skin by washing with CHG (chlorahexidine gluconate) soap before surgery.  CHG is an antiseptic cleaner which kills germs and bonds with the skin to continue killing germs even after washing.  Oral Hygiene is also important in reducing the risk of infection.  Remember to brush your teeth with your regular toothpaste the morning of surgery.  Please  DO NOT use if you have an allergy to CHG or antibacterial soaps.  If your skin becomes reddened/irritated stop using the CHG and inform your nurse when you arrive at Short Stay.  Do not shave (including legs and underarms) for at least 48 hours prior to the first CHG shower.  You may shave your face.  Please follow these instructions carefully:   1.  Shower with CHG Soap the night before surgery and the morning of Surgery.  2.  If you choose to wash your hair, wash your hair first as usual with your normal shampoo.  3.  After you shampoo, rinse your hair and body thoroughly to remove the shampoo. 4.  Use CHG as you would any other liquid soap.  You can apply chg directly to the skin and wash gently with a      scrungie or washcloth.           5.  Apply the CHG Soap to your body ONLY FROM THE NECK DOWN.   Do not use on open wounds or open sores. Avoid contact with your eyes, ears, mouth and genitals (private parts).  Wash genitals (private parts) with your normal soap.  6.  Wash thoroughly, paying special attention to the area where your surgery will be performed.  7.  Thoroughly rinse your body with warm water from the neck down.  8.  DO NOT shower/wash with  your normal soap after using and rinsing off the CHG Soap.  9.  Pat yourself dry with a clean towel.            10.  Wear clean pajamas.            11.  Place clean sheets on your bed the night of your first shower and do not sleep with pets.  Day of Surgery  Do not apply any lotions/deoderants the morning of surgery.   Please wear clean clothes to the hospital/surgery center. Remember to brush your teeth with toothpaste.    Please read over the following fact sheets that you were given. Pain Booklet, MRSA Information and Surgical Site Infection Prevention

## 2018-12-26 NOTE — Progress Notes (Signed)
PCP - Dr. Vic Blackbird Cardiologist -   Chest x-ray -  EKG -12-04-18  Stress Test -  ECHO -11-21-2018  Cardiac Cath -   Sleep Study - had one ordered but did not have done CPAP -   Fasting Blood Sugar -  Checks Blood Sugar _____ times a day  Blood Thinner Instructions: Aspirin Instructions:  Anesthesia review:   Patient denies shortness of breath, fever, cough and chest pain at PAT appointment   Patient verbalized understanding of instructions that were given to them at the PAT appointment. Patient was also instructed that they will need to review over the PAT instructions again at home before surgery.

## 2018-12-27 LAB — NOVEL CORONAVIRUS, NAA (HOSP ORDER, SEND-OUT TO REF LAB; TAT 18-24 HRS): SARS-CoV-2, NAA: NOT DETECTED

## 2018-12-30 ENCOUNTER — Other Ambulatory Visit: Payer: Self-pay

## 2018-12-30 ENCOUNTER — Encounter (HOSPITAL_COMMUNITY)
Admission: RE | Disposition: A | Discharge status: Home / Self Care | Payer: Self-pay | Source: Home / Self Care | Attending: Orthopaedic Surgery

## 2018-12-30 ENCOUNTER — Ambulatory Visit (HOSPITAL_COMMUNITY): Payer: BC Managed Care – PPO | Admitting: Certified Registered Nurse Anesthetist

## 2018-12-30 ENCOUNTER — Ambulatory Visit (HOSPITAL_COMMUNITY): Payer: BC Managed Care – PPO

## 2018-12-30 ENCOUNTER — Encounter (HOSPITAL_COMMUNITY): Payer: Self-pay

## 2018-12-30 ENCOUNTER — Observation Stay (HOSPITAL_COMMUNITY)
Admission: RE | Admit: 2018-12-30 | Discharge: 2019-01-01 | Disposition: A | Discharge status: Home / Self Care | Payer: BC Managed Care – PPO | Attending: Orthopaedic Surgery | Admitting: Orthopaedic Surgery

## 2018-12-30 ENCOUNTER — Observation Stay (HOSPITAL_COMMUNITY): Payer: BC Managed Care – PPO

## 2018-12-30 DIAGNOSIS — Z09 Encounter for follow-up examination after completed treatment for conditions other than malignant neoplasm: Secondary | ICD-10-CM

## 2018-12-30 DIAGNOSIS — Z791 Long term (current) use of non-steroidal anti-inflammatories (NSAID): Secondary | ICD-10-CM | POA: Insufficient documentation

## 2018-12-30 DIAGNOSIS — Z419 Encounter for procedure for purposes other than remedying health state, unspecified: Secondary | ICD-10-CM

## 2018-12-30 DIAGNOSIS — Z6841 Body Mass Index (BMI) 40.0 and over, adult: Secondary | ICD-10-CM | POA: Diagnosis not present

## 2018-12-30 DIAGNOSIS — F17210 Nicotine dependence, cigarettes, uncomplicated: Secondary | ICD-10-CM | POA: Insufficient documentation

## 2018-12-30 DIAGNOSIS — M25551 Pain in right hip: Secondary | ICD-10-CM | POA: Diagnosis present

## 2018-12-30 DIAGNOSIS — M1611 Unilateral primary osteoarthritis, right hip: Principal | ICD-10-CM | POA: Diagnosis present

## 2018-12-30 DIAGNOSIS — I10 Essential (primary) hypertension: Secondary | ICD-10-CM | POA: Insufficient documentation

## 2018-12-30 HISTORY — PX: TOTAL HIP ARTHROPLASTY: SHX124

## 2018-12-30 SURGERY — ARTHROPLASTY, HIP, TOTAL, ANTERIOR APPROACH
Anesthesia: General | Site: Hip | Laterality: Right

## 2018-12-30 MED ORDER — LIDOCAINE 2% (20 MG/ML) 5 ML SYRINGE
INTRAMUSCULAR | Status: DC | PRN
  Administered 2018-12-30: 60 mg via INTRAVENOUS

## 2018-12-30 MED ORDER — PROPOFOL 10 MG/ML IV BOLUS
INTRAVENOUS | Status: DC | PRN
  Administered 2018-12-30: 130 mg via INTRAVENOUS

## 2018-12-30 MED ORDER — OXYCODONE HCL 5 MG/5ML PO SOLN: 5.0000 mg | Freq: Once | ORAL | Status: AC | PRN

## 2018-12-30 MED ORDER — METHOCARBAMOL 1000 MG/10ML IJ SOLN
500.0000 mg | Freq: Four times a day (QID) | INTRAVENOUS | Status: DC | PRN
  Filled 2018-12-30: qty 5

## 2018-12-30 MED ORDER — MIDAZOLAM HCL 2 MG/2ML IJ SOLN
INTRAMUSCULAR | Status: DC | PRN
  Administered 2018-12-30: 2 mg via INTRAVENOUS

## 2018-12-30 MED ORDER — METOCLOPRAMIDE HCL 5 MG PO TABS: 5.0000 mg | ORAL_TABLET | Freq: Three times a day (TID) | ORAL | Status: DC | PRN

## 2018-12-30 MED ORDER — PHENOL 1.4 % MT LIQD: 1.0000 | OROMUCOSAL | Status: DC | PRN

## 2018-12-30 MED ORDER — OXYCODONE HCL 5 MG PO TABS
ORAL_TABLET | ORAL | Status: AC
  Filled 2018-12-30: qty 3

## 2018-12-30 MED ORDER — ESMOLOL HCL 100 MG/10ML IV SOLN
INTRAVENOUS | Status: AC
  Filled 2018-12-30: qty 10

## 2018-12-30 MED ORDER — ONDANSETRON HCL 4 MG PO TABS: 4.0000 mg | ORAL_TABLET | Freq: Four times a day (QID) | ORAL | Status: DC | PRN

## 2018-12-30 MED ORDER — ALBUTEROL SULFATE (2.5 MG/3ML) 0.083% IN NEBU: 3.0000 mL | INHALATION_SOLUTION | RESPIRATORY_TRACT | Status: DC | PRN

## 2018-12-30 MED ORDER — SUGAMMADEX SODIUM 200 MG/2ML IV SOLN
INTRAVENOUS | Status: DC | PRN
  Administered 2018-12-30: 200 mg via INTRAVENOUS

## 2018-12-30 MED ORDER — AMLODIPINE BESYLATE 10 MG PO TABS
10.0000 mg | ORAL_TABLET | Freq: Every day | ORAL | Status: DC
  Administered 2018-12-31 – 2019-01-01 (×2): 10 mg via ORAL
  Filled 2018-12-30 (×3): qty 1

## 2018-12-30 MED ORDER — TRANEXAMIC ACID-NACL 1000-0.7 MG/100ML-% IV SOLN
INTRAVENOUS | Status: AC
  Filled 2018-12-30: qty 100

## 2018-12-30 MED ORDER — LACTATED RINGERS IV SOLN
INTRAVENOUS | Status: DC
  Administered 2018-12-30 (×2): via INTRAVENOUS

## 2018-12-30 MED ORDER — PHENYLEPHRINE 40 MCG/ML (10ML) SYRINGE FOR IV PUSH (FOR BLOOD PRESSURE SUPPORT)
PREFILLED_SYRINGE | INTRAVENOUS | Status: DC | PRN
  Administered 2018-12-30: 80 ug via INTRAVENOUS

## 2018-12-30 MED ORDER — DEXAMETHASONE SODIUM PHOSPHATE 10 MG/ML IJ SOLN
INTRAMUSCULAR | Status: DC | PRN
  Administered 2018-12-30: 10 mg via INTRAVENOUS

## 2018-12-30 MED ORDER — SODIUM CHLORIDE 0.9 % IV SOLN
INTRAVENOUS | Status: DC
  Administered 2018-12-30: 18:00:00 via INTRAVENOUS

## 2018-12-30 MED ORDER — OXYCODONE HCL 5 MG PO TABS
5.0000 mg | ORAL_TABLET | Freq: Once | ORAL | Status: AC | PRN
  Administered 2018-12-30: 5 mg via ORAL

## 2018-12-30 MED ORDER — CHLORHEXIDINE GLUCONATE 4 % EX LIQD: 60.0000 mL | Freq: Once | CUTANEOUS | Status: DC

## 2018-12-30 MED ORDER — PROMETHAZINE HCL 25 MG/ML IJ SOLN: 6.2500 mg | INTRAMUSCULAR | Status: DC | PRN

## 2018-12-30 MED ORDER — FENTANYL CITRATE (PF) 250 MCG/5ML IJ SOLN
INTRAMUSCULAR | Status: AC
  Filled 2018-12-30: qty 5

## 2018-12-30 MED ORDER — DOCUSATE SODIUM 100 MG PO CAPS
100.0000 mg | ORAL_CAPSULE | Freq: Two times a day (BID) | ORAL | Status: DC
  Administered 2018-12-30 – 2019-01-01 (×4): 100 mg via ORAL
  Filled 2018-12-30 (×4): qty 1

## 2018-12-30 MED ORDER — HYDROMORPHONE HCL 1 MG/ML IJ SOLN
0.2500 mg | INTRAMUSCULAR | Status: DC | PRN
  Administered 2018-12-30 (×4): 0.5 mg via INTRAVENOUS

## 2018-12-30 MED ORDER — ESMOLOL HCL 100 MG/10ML IV SOLN
INTRAVENOUS | Status: DC | PRN
  Administered 2018-12-30: 20 mg via INTRAVENOUS

## 2018-12-30 MED ORDER — ONDANSETRON HCL 4 MG/2ML IJ SOLN: 4.0000 mg | Freq: Four times a day (QID) | INTRAMUSCULAR | Status: DC | PRN

## 2018-12-30 MED ORDER — ROCURONIUM BROMIDE 100 MG/10ML IV SOLN
INTRAVENOUS | Status: DC | PRN
  Administered 2018-12-30: 50 mg via INTRAVENOUS

## 2018-12-30 MED ORDER — 0.9 % SODIUM CHLORIDE (POUR BTL) OPTIME
TOPICAL | Status: DC | PRN
  Administered 2018-12-30: 1000 mL

## 2018-12-30 MED ORDER — HYDROMORPHONE HCL 1 MG/ML IJ SOLN
INTRAMUSCULAR | Status: AC
  Filled 2018-12-30: qty 2

## 2018-12-30 MED ORDER — VANCOMYCIN HCL 10 G IV SOLR
1500.0000 mg | INTRAVENOUS | Status: AC
  Administered 2018-12-30: 1500 mg via INTRAVENOUS
  Filled 2018-12-30: qty 1500

## 2018-12-30 MED ORDER — BUPIVACAINE-EPINEPHRINE (PF) 0.25% -1:200000 IJ SOLN
INTRAMUSCULAR | Status: AC
  Filled 2018-12-30: qty 30

## 2018-12-30 MED ORDER — VANCOMYCIN HCL IN DEXTROSE 1-5 GM/200ML-% IV SOLN: 1000.0000 mg | INTRAVENOUS | Status: DC

## 2018-12-30 MED ORDER — ASPIRIN EC 325 MG PO TBEC
325.0000 mg | DELAYED_RELEASE_TABLET | Freq: Every day | ORAL | Status: DC
  Administered 2018-12-31 – 2019-01-01 (×2): 325 mg via ORAL
  Filled 2018-12-30 (×2): qty 1

## 2018-12-30 MED ORDER — DEXAMETHASONE SODIUM PHOSPHATE 10 MG/ML IJ SOLN
INTRAMUSCULAR | Status: AC
  Filled 2018-12-30: qty 1

## 2018-12-30 MED ORDER — FENTANYL CITRATE (PF) 100 MCG/2ML IJ SOLN
INTRAMUSCULAR | Status: DC | PRN
  Administered 2018-12-30 (×3): 50 ug via INTRAVENOUS
  Administered 2018-12-30: 100 ug via INTRAVENOUS
  Administered 2018-12-30 (×5): 50 ug via INTRAVENOUS

## 2018-12-30 MED ORDER — HYDROMORPHONE HCL 1 MG/ML IJ SOLN: 0.5000 mg | INTRAMUSCULAR | Status: DC | PRN

## 2018-12-30 MED ORDER — ONDANSETRON HCL 4 MG/2ML IJ SOLN
INTRAMUSCULAR | Status: DC | PRN
  Administered 2018-12-30: 4 mg via INTRAVENOUS

## 2018-12-30 MED ORDER — VANCOMYCIN HCL IN DEXTROSE 1-5 GM/200ML-% IV SOLN
1000.0000 mg | Freq: Two times a day (BID) | INTRAVENOUS | Status: AC
  Administered 2018-12-30: 1000 mg via INTRAVENOUS
  Filled 2018-12-30: qty 200

## 2018-12-30 MED ORDER — OXYCODONE HCL 5 MG PO TABS
5.0000 mg | ORAL_TABLET | ORAL | Status: DC | PRN
  Administered 2018-12-30 – 2019-01-01 (×11): 10 mg via ORAL
  Filled 2018-12-30 (×10): qty 2

## 2018-12-30 MED ORDER — METOCLOPRAMIDE HCL 5 MG/ML IJ SOLN: 5.0000 mg | Freq: Three times a day (TID) | INTRAMUSCULAR | Status: DC | PRN

## 2018-12-30 MED ORDER — VANCOMYCIN HCL IN DEXTROSE 1-5 GM/200ML-% IV SOLN
INTRAVENOUS | Status: AC
  Administered 2018-12-30: 1000 mg
  Filled 2018-12-30: qty 200

## 2018-12-30 MED ORDER — MIDAZOLAM HCL 2 MG/2ML IJ SOLN
INTRAMUSCULAR | Status: AC
  Filled 2018-12-30: qty 2

## 2018-12-30 MED ORDER — POLYETHYLENE GLYCOL 3350 17 G PO PACK: 17.0000 g | PACK | Freq: Every day | ORAL | Status: DC | PRN

## 2018-12-30 MED ORDER — METHOCARBAMOL 500 MG PO TABS
ORAL_TABLET | ORAL | Status: AC
  Filled 2018-12-30: qty 1

## 2018-12-30 MED ORDER — AMLODIPINE BESYLATE 10 MG PO TABS: 10.0000 mg | ORAL_TABLET | Freq: Every day | ORAL | Status: DC

## 2018-12-30 MED ORDER — TRANEXAMIC ACID-NACL 1000-0.7 MG/100ML-% IV SOLN
INTRAVENOUS | Status: DC | PRN
  Administered 2018-12-30: 1000 mg via INTRAVENOUS

## 2018-12-30 MED ORDER — METHOCARBAMOL 500 MG PO TABS
500.0000 mg | ORAL_TABLET | Freq: Four times a day (QID) | ORAL | Status: DC | PRN
  Administered 2018-12-30 – 2019-01-01 (×6): 500 mg via ORAL
  Filled 2018-12-30 (×5): qty 1

## 2018-12-30 MED ORDER — ACETAMINOPHEN 325 MG PO TABS
325.0000 mg | ORAL_TABLET | Freq: Four times a day (QID) | ORAL | Status: DC | PRN
  Administered 2018-12-30 – 2019-01-01 (×3): 650 mg via ORAL
  Filled 2018-12-30 (×3): qty 2

## 2018-12-30 MED ORDER — MENTHOL 3 MG MT LOZG: 1.0000 | LOZENGE | OROMUCOSAL | Status: DC | PRN

## 2018-12-30 SURGICAL SUPPLY — 65 items
ADH SKN CLS APL DERMABOND .7 (GAUZE/BANDAGES/DRESSINGS) ×1
APL SKNCLS STERI-STRIP NONHPOA (GAUZE/BANDAGES/DRESSINGS) ×1
BALL HIP CERAMIC (Hips) IMPLANT
BENZOIN TINCTURE PRP APPL 2/3 (GAUZE/BANDAGES/DRESSINGS) ×2 IMPLANT
BLADE CLIPPER SURG (BLADE) IMPLANT
BLADE SAW SGTL 18X1.27X75 (BLADE) ×2 IMPLANT
CELLS DAT CNTRL 66122 CELL SVR (MISCELLANEOUS) ×1 IMPLANT
COVER SURGICAL LIGHT HANDLE (MISCELLANEOUS) ×2 IMPLANT
COVER WAND RF STERILE (DRAPES) ×2 IMPLANT
DERMABOND ADVANCED (GAUZE/BANDAGES/DRESSINGS) ×1
DERMABOND ADVANCED .7 DNX12 (GAUZE/BANDAGES/DRESSINGS) IMPLANT
DRAPE C-ARM 42X72 X-RAY (DRAPES) ×2 IMPLANT
DRAPE IMP U-DRAPE 54X76 (DRAPES) ×2 IMPLANT
DRAPE STERI IOBAN 125X83 (DRAPES) ×2 IMPLANT
DRAPE U-SHAPE 47X51 STRL (DRAPES) ×6 IMPLANT
DRSG MEPILEX BORDER 4X12 (GAUZE/BANDAGES/DRESSINGS) ×1 IMPLANT
DRSG MEPILEX BORDER 4X8 (GAUZE/BANDAGES/DRESSINGS) ×2 IMPLANT
DRSG PAD ABDOMINAL 8X10 ST (GAUZE/BANDAGES/DRESSINGS) ×1 IMPLANT
DURAPREP 26ML APPLICATOR (WOUND CARE) ×2 IMPLANT
ELECT BLADE 4.0 EZ CLEAN MEGAD (MISCELLANEOUS)
ELECT CAUTERY BLADE 6.4 (BLADE) ×2 IMPLANT
ELECT REM PT RETURN 9FT ADLT (ELECTROSURGICAL) ×2
ELECTRODE BLDE 4.0 EZ CLN MEGD (MISCELLANEOUS) IMPLANT
ELECTRODE REM PT RTRN 9FT ADLT (ELECTROSURGICAL) ×1 IMPLANT
ELIMINATOR HOLE APEX DEPUY (Hips) ×1 IMPLANT
FACESHIELD WRAPAROUND (MASK) ×4 IMPLANT
FACESHIELD WRAPAROUND OR TEAM (MASK) ×2 IMPLANT
GAUZE SPONGE 4X4 12PLY STRL LF (GAUZE/BANDAGES/DRESSINGS) ×1 IMPLANT
GLOVE BIOGEL PI IND STRL 8 (GLOVE) ×2 IMPLANT
GLOVE BIOGEL PI INDICATOR 8 (GLOVE) ×2
GLOVE ORTHO TXT STRL SZ7.5 (GLOVE) ×4 IMPLANT
GLOVE SURG SS PI 6.5 STRL IVOR (GLOVE) ×1 IMPLANT
GOWN STRL REUS W/ TWL LRG LVL3 (GOWN DISPOSABLE) ×1 IMPLANT
GOWN STRL REUS W/ TWL XL LVL3 (GOWN DISPOSABLE) ×1 IMPLANT
GOWN STRL REUS W/TWL 2XL LVL3 (GOWN DISPOSABLE) ×2 IMPLANT
GOWN STRL REUS W/TWL LRG LVL3 (GOWN DISPOSABLE) ×2
GOWN STRL REUS W/TWL XL LVL3 (GOWN DISPOSABLE) ×4
HIP BALL CERAMIC (Hips) ×2 IMPLANT
KIT BASIN OR (CUSTOM PROCEDURE TRAY) ×2 IMPLANT
KIT TURNOVER KIT B (KITS) ×2 IMPLANT
LINER ACET PNNCL PLUS4 NEUTRAL (Hips) IMPLANT
LINER PINN ACET GRIP 50X100 ×1 IMPLANT
MANIFOLD NEPTUNE II (INSTRUMENTS) ×2 IMPLANT
NS IRRIG 1000ML POUR BTL (IV SOLUTION) ×2 IMPLANT
PACK TOTAL JOINT (CUSTOM PROCEDURE TRAY) ×2 IMPLANT
PAD ARMBOARD 7.5X6 YLW CONV (MISCELLANEOUS) ×4 IMPLANT
PINNACLE PLUS 4 NEUTRAL (Hips) ×2 IMPLANT
RETRACTOR WND ALEXIS 18 MED (MISCELLANEOUS) ×1 IMPLANT
RTRCTR WOUND ALEXIS 18CM MED (MISCELLANEOUS) ×2
STEM FEM SZ3 STD ACTIS (Stem) ×1 IMPLANT
STRIP CLOSURE SKIN 1/2X4 (GAUZE/BANDAGES/DRESSINGS) ×2 IMPLANT
SUCTION FRAZIER HANDLE 10FR (MISCELLANEOUS) ×1
SUCTION TUBE FRAZIER 10FR DISP (MISCELLANEOUS) IMPLANT
SUT MON AB 2-0 CT1 36 (SUTURE) IMPLANT
SUT VIC AB 0 CT1 27 (SUTURE) ×2
SUT VIC AB 0 CT1 27XBRD ANBCTR (SUTURE) ×1 IMPLANT
SUT VIC AB 2-0 CT1 27 (SUTURE) ×4
SUT VIC AB 2-0 CT1 TAPERPNT 27 (SUTURE) ×1 IMPLANT
SUT VICRYL 4-0 PS2 18IN ABS (SUTURE) ×2 IMPLANT
SUT VLOC 180 0 24IN GS25 (SUTURE) ×2 IMPLANT
TOWEL OR 17X24 6PK STRL BLUE (TOWEL DISPOSABLE) ×2 IMPLANT
TOWEL OR 17X26 10 PK STRL BLUE (TOWEL DISPOSABLE) ×4 IMPLANT
TRAY CATH 16FR W/PLASTIC CATH (SET/KITS/TRAYS/PACK) IMPLANT
TRAY FOLEY MTR SLVR 16FR STAT (SET/KITS/TRAYS/PACK) ×1 IMPLANT
WATER STERILE IRR 1000ML POUR (IV SOLUTION) ×4 IMPLANT

## 2018-12-30 NOTE — Anesthesia Preprocedure Evaluation (Addendum)
Anesthesia Evaluation  Patient identified by MRN, date of birth, ID band Patient awake    Reviewed: Allergy & Precautions, NPO status , Patient's Chart, lab work & pertinent test results  Airway Mallampati: I  TM Distance: >3 FB     Dental  (+) Teeth Intact   Pulmonary neg pulmonary ROS, Current Smoker,    Pulmonary exam normal        Cardiovascular Exercise Tolerance: Poor hypertension, Pt. on medications negative cardio ROS Normal cardiovascular exam Rhythm:Regular Rate:Normal  - Left ventricle: The cavity size was normal. Wall thickness was   increased in a pattern of mild LVH. Systolic function was normal.   The estimated ejection fraction was in the range of 60% to 65%.   Wall motion was normal; there were no regional wall motion   Abnormalities.  (2018)  Normal sinus rhythm Minimal voltage criteria for LVH, may be normal variant Cannot rule out Anterior infarct , age undetermined Abnormal ECG   (nov 2018)   Neuro/Psych negative neurological ROS  negative psych ROS   GI/Hepatic negative GI ROS, Neg liver ROS,   Endo/Other  Morbid obesity  Renal/GU negative Renal ROSResults for Julia Avery, Julia Avery (MRN [id number]) as of 05/21/2017 11:10  05/18/2017 07:12 Sodium: 142 Potassium: 3.7 Chloride: 106 CO2: 25 Glucose: 107 (H) BUN: 11 Creatinine: 0.64   negative genitourinary   Musculoskeletal  (+) Arthritis , Osteoarthritis,    Abdominal Normal abdominal exam  (+) + obese,  Abdomen: soft.    Peds negative pediatric ROS (+)  Hematology negative hematology ROS (+)   Anesthesia Other Findings   Reproductive/Obstetrics negative OB ROS                             Anesthesia Physical  Anesthesia Plan  ASA: III  Anesthesia Plan: General   Post-op Pain Management:    Induction: Intravenous  PONV Risk Score and Plan: 2 and Ondansetron, Treatment may vary due to age or  medical condition and Midazolam  Airway Management Planned: Oral ETT and LMA  Additional Equipment:   Intra-op Plan:   Post-operative Plan:   Informed Consent: I have reviewed the patients History and Physical, chart, labs and discussed the procedure including the risks, benefits and alternatives for the proposed anesthesia with the patient or authorized representative who has indicated his/her understanding and acceptance.     Dental advisory given  Plan Discussed with: CRNA  Anesthesia Plan Comments:        Anesthesia Quick Evaluation

## 2018-12-30 NOTE — Interval H&P Note (Signed)
History and Physical Interval Note:  12/30/2018 1:40 PM  Julia Avery  has presented today for surgery, with the diagnosis of right hip osteoarthritis.  The various methods of treatment have been discussed with the patient and family. After consideration of risks, benefits and other options for treatment, the patient has consented to  Procedure(s): RIGHT TOTAL HIP ARTHROPLASTY-DIRECT ANTERIOR (Right) as a surgical intervention.  The patient's history has been reviewed, patient examined, no change in status, stable for surgery.  I have reviewed the patient's chart and labs.  Questions were answered to the patient's satisfaction.     Julia Avery

## 2018-12-30 NOTE — Transfer of Care (Signed)
Immediate Anesthesia Transfer of Care Note  Patient: Julia Avery  Procedure(s) Performed: RIGHT TOTAL HIP ARTHROPLASTY-DIRECT ANTERIOR (Right Hip)  Patient Location: PACU  Anesthesia Type:General  Level of Consciousness: awake, alert , oriented and patient cooperative  Airway & Oxygen Therapy: Patient Spontanous Breathing  Post-op Assessment: Report given to RN and Post -op Vital signs reviewed and stable  Post vital signs: Reviewed and stable  Last Vitals:  Vitals Value Taken Time  BP 153/116 12/30/18 1633  Temp    Pulse 92 12/30/18 1634  Resp 20 12/30/18 1634  SpO2 96 % 12/30/18 1634  Vitals shown include unvalidated device data.  Last Pain:  Vitals:   12/30/18 1214  TempSrc:   PainSc: 0-No pain         Complications: No apparent anesthesia complications

## 2018-12-30 NOTE — Anesthesia Procedure Notes (Signed)
Procedure Name: Intubation Date/Time: 12/30/2018 2:03 PM Performed by: Gwyndolyn Saxon, CRNA Pre-anesthesia Checklist: Patient identified, Emergency Drugs available, Suction available and Patient being monitored Patient Re-evaluated:Patient Re-evaluated prior to induction Oxygen Delivery Method: Circle system utilized Preoxygenation: Pre-oxygenation with 100% oxygen Induction Type: IV induction Ventilation: Mask ventilation without difficulty Laryngoscope Size: Miller and 2 Grade View: Grade I Tube type: Oral Tube size: 7.0 mm Number of attempts: 1 Airway Equipment and Method: Patient positioned with wedge pillow and Stylet Placement Confirmation: ETT inserted through vocal cords under direct vision,  positive ETCO2 and breath sounds checked- equal and bilateral Secured at: 20 cm Tube secured with: Tape Dental Injury: Teeth and Oropharynx as per pre-operative assessment

## 2018-12-30 NOTE — Op Note (Signed)
Preop diagnosis: Right hip osteoarthritis  Postop diagnosis: Same  Procedure: Right total hip arthroplasty, direct anterior approach  Surgeon: Rodell Perna, MD  Assistant: Benjiman Core, PA-C medically necessary and present for the entire procedure until wound closure time.  Second assist April Fulp RNFA  Anesthesia: General.  Implants:Depuy 69mm Gription cup plus 18mm neutral poly liner, size 3 Actis pressfit stem , ceramic ball plus 21mm X72mm  EBL : 360ml  Procedure: After induction of anesthesia patient placed on the [place]ana table C arm was brought in both hips were placed in 30 degrees external rotation visualization of both hips were seen.  Standard prepping and draping was performed preoperative vancomycin was given to due to penicillin allergy and TXA was given.  Hip was prepped with DuraPrep large shower curtain drape was applied U/sheets above and below hydraulic hook was applied sealed with Steri-Drape where the whole lead been used to go to the drape.  Timeout procedure was completed.  Incision was made starting anterior and inferior 2 cm to the ASIS obliquely over the trochanter past 1 to 2 cm anteriorly due to the patient's larger size.  Fascia was identified after 8 cm of adipose tissue was divided fascia was nicked extended and then dull cobra was placed over the top of the capsule transverse arterial bleeders were identified carefully coagulated.  Serum was brought in were initially low on the femur at the level of the lesser troches.  Further exposure proximally on the femur up to the inotrope line was performed checked under C arm again and the neck was cut 1 fingerbreadth above the lesser trochanter with dull cobras on each side of the neck.  Head was removed with a corkscrew there is extensive arthritis.  Sequential reaming up to 49 mm at 50 mm cup was selected was not completely down we had to remove it freshened up with a 47 and 49 reamer again and then reimplantation it was secure  tight central hole was filled and +4 neutral liner was impacted using the finishing impactor ball.  Cup was checked secure liner had been dialed incorrectly checked with a Valora Corporal.  Posterior capsule was released starting at the level neck proximally.  Hydraulic hook was applied leg was taken off external rotation 110 degrees taken down and under hydraulic hook applied Mueller retractor medially large trochanteric Homan placed behind the trochanter.  We progressed up to a size 3 which gave an excellent fill of the canal was tight.  With external rotation at extension 45 there was subluxation at 80 degrees and we progressed up to +5 neck which gave good stability no shock and was stable in neutral position with external rotation past 90 degrees.  Permanent stem was inserted.  AP and lateral fluoroscopy of the femur had been looked at to make sure the stem was directly down the middle of the canal in good position neck length looks good.  Lines drawn across the trochanter through the ischio tuberosity showed her neck length was identical to the opposite side.  Canal was irrigated stem was placed.  Ceramic ball was placed with patient's age 63 hip was reduced again with identical findings of good stability.  Both sides deeper checked there was no bleeding.  Arterial bleeders were rechecked and did not require additional coagulation.  A V lock running suture was used in the fascia.  2 in the subtendinous tissue skin staple closure postop dressing and transferred recovery room.  Patient tolerated procedure well.

## 2018-12-31 ENCOUNTER — Encounter (HOSPITAL_COMMUNITY): Payer: Self-pay | Admitting: Orthopaedic Surgery

## 2018-12-31 DIAGNOSIS — M1611 Unilateral primary osteoarthritis, right hip: Secondary | ICD-10-CM | POA: Diagnosis not present

## 2018-12-31 LAB — BASIC METABOLIC PANEL
Anion gap: 7 (ref 5–15)
BUN: 12 mg/dL (ref 6–20)
CO2: 24 mmol/L (ref 22–32)
Calcium: 8.5 mg/dL — ABNORMAL LOW (ref 8.9–10.3)
Chloride: 109 mmol/L (ref 98–111)
Creatinine, Ser: 0.7 mg/dL (ref 0.44–1.00)
GFR calc Af Amer: >60 mL/min (ref >60)
GFR calc non Af Amer: >60 mL/min (ref >60)
Glucose, Bld: 149 mg/dL — ABNORMAL HIGH (ref 70–99)
Potassium: 3.8 mmol/L (ref 3.5–5.1)
Sodium: 140 mmol/L (ref 135–145)

## 2018-12-31 LAB — CBC
HCT: 32.4 % — ABNORMAL LOW (ref 36.0–46.0)
Hemoglobin: 10.4 g/dL — ABNORMAL LOW (ref 12.0–15.0)
MCH: 29.5 pg (ref 26.0–34.0)
MCHC: 32.1 g/dL (ref 30.0–36.0)
MCV: 91.8 fL (ref 80.0–100.0)
Platelets: 332 10*3/uL (ref 150–400)
RBC: 3.53 MIL/uL — ABNORMAL LOW (ref 3.87–5.11)
RDW: 16 % — ABNORMAL HIGH (ref 11.5–15.5)
WBC: 11.6 10*3/uL — ABNORMAL HIGH (ref 4.0–10.5)
nRBC: 0 % (ref 0.0–0.2)

## 2018-12-31 NOTE — Progress Notes (Signed)
Physical Therapy Treatment Patient Details Name: Julia Avery MRN: [id number] DOB: [birth year] Today's Date: 12/31/2018    History of Present Illness Pt is a 63 y.o. female s/p elective R THA (direct anterior approach) on 12/30/18. PMH includes obesity, HTN, back pain, arthritis.   PT Comments    Pt seen for afternoon session, progressing well with mobility. Ambulating with RW at [place]. Able to initiate stair training with rail support and min guard. Initiated RLE therex. Will continue to follow acutely.   Follow Up Recommendations  Home health PT;Follow surgeon's recommendation for DC plan and follow-up therapies;Supervision for mobility/OOB     Equipment Recommendations  Rolling walker with 5" wheels;3in1 (PT)    Recommendations for Other Services       Precautions / Restrictions Precautions Precautions: Fall Restrictions Weight Bearing Restrictions: Yes RLE Weight Bearing: Weight bearing as tolerated    Mobility  Bed Mobility Overal bed mobility: Needs Assistance Bed Mobility: Supine to Sit;Sit to Supine     Supine to sit: Modified independent (Device/Increase time);HOB elevated Sit to supine: Mod assist   General bed mobility comments: Mod indep to sit with HOB elevated, reliant on UE support to assist RLE to EOB. Required modA to assist RLE back into bed; attempted using sheet for AAROM, but pt still requiring assist  Transfers   Equipment used: Rolling walker (2 wheeled) Transfers: Sit to/from Stand Sit to Stand: Supervision         General transfer comment: Improved technique standing with RW. 1x cues for technique as pt leaving RW behind before going to sit on bed  Ambulation/Gait Ambulation/Gait assistance: Supervision Gait Distance (Feet): 180 Feet Assistive device: Rolling walker (2 wheeled) Gait Pattern/deviations: Step-through pattern;Decreased stride length;Decreased weight shift to right;Antalgic Gait velocity: Decreased Gait  velocity interpretation: <1.31 ft/sec, indicative of household ambulator General Gait Details: Slow, antalgic gait with RW; improved WBAT through RLE. Supervision for safety. Periodic standing rest breaks due to pain   Stairs Stairs: Yes Stairs assistance: Min guard Stair Management: Two rails;One rail Right;Step to pattern;Forwards;Sideways Number of Stairs: 4 General stair comments: Ascend/descended 2 steps with bilateral rail support and min guard; pt reports rails too far apart at home, second trial with BUE support on R-side rail. Min guard for safety   Wheelchair Mobility    Modified Rankin (Stroke Patients Only)       Balance Overall balance assessment: Needs assistance   Sitting balance-Leahy Scale: Fair       Standing balance-Leahy Scale: Fair Standing balance comment: Can static stand without UE support                            Cognition Arousal/Alertness: Awake/alert Behavior During Therapy: WFL for tasks assessed/performed Overall Cognitive Status: Within Functional Limits for tasks assessed                                        Exercises Total Joint Exercises Quad Sets: AROM;Right;Supine Heel Slides: AAROM;Right;Supine(use of sheet) Hip ABduction/ADduction: AAROM;Right;Supine(use of sheet)    General Comments        Pertinent Vitals/Pain Pain Assessment: Faces Faces Pain Scale: Hurts even more Pain Location: R hip and knee Pain Descriptors / Indicators: Sore;Tightness;Grimacing;Guarding Pain Intervention(s): Monitored during session    Home Living  Prior Function            PT Goals (current goals can now be found in the care plan section) Acute Rehab PT Goals Patient Stated Goal: Return home tomorrow PT Goal Formulation: With patient Time For Goal Achievement: 01/14/19 Potential to Achieve Goals: Good Progress towards PT goals: Progressing toward goals    Frequency     7X/week      PT Plan Current plan remains appropriate    Co-evaluation              AM-PAC PT "6 Clicks" Mobility   Outcome Measure  Help needed turning from your back to your side while in a flat bed without using bedrails?: A Little Help needed moving from lying on your back to sitting on the side of a flat bed without using bedrails?: A Lot Help needed moving to and from a bed to a chair (including a wheelchair)?: A Little Help needed standing up from a chair using your arms (e.g., wheelchair or bedside chair)?: A Little Help needed to walk in hospital room?: A Little Help needed climbing 3-5 steps with a railing? : A Little 6 Click Score: 17    End of Session Equipment Utilized During Treatment: Gait belt Activity Tolerance: Patient tolerated treatment well Patient left: in bed;with call bell/phone within reach Nurse Communication: Mobility status PT Visit Diagnosis: Other abnormalities of gait and mobility (R26.89);Pain Pain - Right/Left: Right Pain - part of body: Hip     Time: 3491-7915 PT Time Calculation (min) (ACUTE ONLY): 19 min  Charges:  $Gait Training: 8-22 mins                    Mabeline Caras, PT, DPT Acute Rehabilitation Services  Pager [phone] Office [place] 12/31/2018, 4:41 PM

## 2018-12-31 NOTE — Evaluation (Signed)
Physical Therapy Evaluation Patient Details Name: Julia Avery MRN: [id number] DOB: 1955-07-21 Today's Date: 12/31/2018   History of Present Illness  Pt is a 63 y.o. female s/p elective R THA (direct anterior approach) on 12/30/18. PMH includes obesity, HTN, back pain, arthritis.    Clinical Impression  Pt presents with an overall decrease in functional mobility secondary to above. PTA, pt indep and lives alone, works full-time; will have 24/7 assist from family at d/c. Educ on precautions, positioning, therex, and importance of mobility. Today, pt able to initiate transfer and gait training with RW, requiring up to minA to mobilize RLE. Pt would benefit from continued acute PT services to maximize functional mobility and independence prior to d/c home.     Follow Up Recommendations Home health PT;Follow surgeon's recommendation for DC plan and follow-up therapies;Supervision for mobility/OOB    Equipment Recommendations  Rolling walker with 5" wheels;3in1 (PT)    Recommendations for Other Services       Precautions / Restrictions Precautions Precautions: Fall Restrictions Weight Bearing Restrictions: Yes RLE Weight Bearing: Weight bearing as tolerated      Mobility  Bed Mobility Overal bed mobility: Needs Assistance Bed Mobility: Supine to Sit     Supine to sit: Min assist;HOB elevated     General bed mobility comments: MinA to assist RLE to EOB;  Transfers Overall transfer level: Needs assistance Equipment used: Rolling walker (2 wheeled) Transfers: Sit to/from Stand Sit to Stand: Min guard         General transfer comment: Stood from bed, BSC (over toilet) and recliner to RW with min guard; cues for correct hand placement and eccentric control  Ambulation/Gait Ambulation/Gait assistance: Min guard Gait Distance (Feet): 200 Feet Assistive device: Rolling walker (2 wheeled) Gait Pattern/deviations: Step-through pattern;Decreased stride length;Decreased  weight shift to right;Antalgic Gait velocity: Decreased Gait velocity interpretation: <1.31 ft/sec, indicative of household ambulator General Gait Details: Slow, antalgic gait with RW and intermittent min guard for balance; further distance limited by pain and fatigue. Heavy reliance on BUE support; cues for sequencing with RW  Stairs            Wheelchair Mobility    Modified Rankin (Stroke Patients Only)       Balance Overall balance assessment: Needs assistance   Sitting balance-Leahy Scale: Fair Sitting balance - Comments: Able to don/doff L shoe; assist to reach R foot     Standing balance-Leahy Scale: Fair Standing balance comment: Can static stand without UE support                             Pertinent Vitals/Pain Pain Assessment: Faces Faces Pain Scale: Hurts even more Pain Location: R hip and knee Pain Descriptors / Indicators: Sore;Tightness Pain Intervention(s): Monitored during session;Patient requesting pain meds-RN notified    Home Living Family/patient expects to be discharged to:: Private residence Living Arrangements: Alone Available Help at Discharge: Family;Available PRN/intermittently Type of Home: House Home Access: Stairs to enter Entrance Stairs-Rails: Right;Left;Can reach both Entrance Stairs-Number of Steps: 2 Home Layout: One level Home Equipment: Cane - single point;Shower seat;Grab bars - toilet Additional Comments: Brother and son staying with pt to provide initial 24/7 assist    Prior Function Level of Independence: Independent with assistive device(s)         Comments: Lives alone, Intermittent use of SPC due to R hip pain. Works in [employer], reports it is an active job     Engineer, manufacturing  [place]        Extremity/Trunk Assessment   Upper Extremity Assessment Upper Extremity Assessment: Overall WFL for tasks assessed    Lower Extremity Assessment Lower Extremity Assessment: RLE deficits/detail RLE Deficits /  Details: Hip flex <3/5, knee ext < 3/5 RLE: Unable to fully assess due to pain       Communication   Communication: No difficulties  Cognition Arousal/Alertness: Awake/alert Behavior During Therapy: WFL for tasks assessed/performed Overall Cognitive Status: Within Functional Limits for tasks assessed                                        General Comments      Exercises     Assessment/Plan    PT Assessment Patient needs continued PT services  PT Problem List Decreased strength;Decreased range of motion;Decreased activity tolerance;Decreased balance;Decreased mobility;Decreased knowledge of use of DME;Decreased knowledge of precautions;Pain       PT Treatment Interventions DME instruction;Gait training;Stair training;Functional mobility training;Therapeutic activities;Therapeutic exercise;Balance training;Patient/family education    PT Goals (Current goals can be found in the Care Plan section)  Acute Rehab PT Goals Patient Stated Goal: Return home tomorrow PT Goal Formulation: With patient Time For Goal Achievement: 01/14/19 Potential to Achieve Goals: Good    Frequency 7X/week   Barriers to discharge Decreased caregiver support      Co-evaluation               AM-PAC PT "6 Clicks" Mobility  Outcome Measure Help needed turning from your back to your side while in a flat bed without using bedrails?: A Little Help needed moving from lying on your back to sitting on the side of a flat bed without using bedrails?: A Little Help needed moving to and from a bed to a chair (including a wheelchair)?: A Little Help needed standing up from a chair using your arms (e.g., wheelchair or bedside chair)?: A Little Help needed to walk in hospital room?: A Little Help needed climbing 3-5 steps with a railing? : A Little 6 Click Score: 18    End of Session Equipment Utilized During Treatment: Gait belt Activity Tolerance: Patient tolerated treatment  well Patient left: in chair;with call bell/phone within reach Nurse Communication: Mobility status PT Visit Diagnosis: Other abnormalities of gait and mobility (R26.89);Pain Pain - Right/Left: Right Pain - part of body: Hip    Time: 1583-0940 PT Time Calculation (min) (ACUTE ONLY): 32 min   Charges:   PT Evaluation $PT Eval Moderate Complexity: 1 Mod PT Treatments $Gait Training: 8-22 mins      Mabeline Caras, PT, DPT Acute Rehabilitation Services  Pager [phone] Office [place] 12/31/2018, 10:07 AM

## 2018-12-31 NOTE — Progress Notes (Signed)
[place] NT reported that the patient has lost her phone.  Linens were changed today and the Tech has gone through linens on the floor.  The linen service within the hospital has been notified.  The patient is aware of the search for her phone.  The patient is upset regarding this situation

## 2018-12-31 NOTE — Progress Notes (Signed)
   Subjective: 1 Day Post-Op Procedure(s) (LRB): RIGHT TOTAL HIP ARTHROPLASTY-DIRECT ANTERIOR (Right) Patient reports pain as mild.    Objective: Vital signs in last 24 hours: Temp:  [97.2 F (36.2 C)-99.1 F (37.3 C)] 98.7 F (37.1 C) (06/16 0352) Pulse Rate:  [71-85] 76 (06/16 0352) Resp:  [11-18] 16 (06/16 0352) BP: (145-174)/(73-97) 145/73 (06/16 0352) SpO2:  [97 %-100 %] 98 % (06/16 0352) Weight:  [108.9 kg] 108.9 kg (06/15 1131)  Intake/Output from previous day: 06/15 0701 - 06/16 0700 In: 1740.9 [P.O.:440; I.V.:1053.9; IV Piggyback:247] Out: 1760 [Urine:1410; Blood:350] Intake/Output this shift: No intake/output data recorded.  Recent Labs    12/31/18 0527  HGB 10.4*   Recent Labs    12/31/18 0527  WBC 11.6*  RBC 3.53*  HCT 32.4*  PLT 332   Recent Labs    12/31/18 0527  NA 140  K 3.8  CL 109  CO2 24  BUN 12  CREATININE 0.70  GLUCOSE 149*  CALCIUM 8.5*   No results for input(s): LABPT, INR in the last 72 hours.  Neurologically intact Dg C-arm 1-60 Min  Result Date: 12/30/2018 CLINICAL DATA:  Hip replacement EXAM: DG C-ARM 61-120 MIN COMPARISON:  None. FINDINGS: Two intraoperative images were obtained. The patient has undergone total hip arthroplasty on the right. The alignment is near anatomic. Total fluoroscopy time was 37 seconds. IMPRESSION: Status post ORIF of the right hip. Electronically Signed   By: Constance Holster M.D.   On: 12/30/2018 16:20   Dg Hip Port Unilat With Pelvis 1v Right  Result Date: 12/30/2018 CLINICAL DATA:  Right hip replacement EXAM: DG HIP (WITH OR WITHOUT PELVIS) 1V PORT RIGHT COMPARISON:  None. FINDINGS: Satisfactory right hip replacement.  No fracture or complication. IMPRESSION: Satisfactory right hip replacement. Electronically Signed   By: Franchot Gallo M.D.   On: 12/30/2018 17:18   Dg Hip Operative Unilat W Or W/o Pelvis Right  Result Date: 12/30/2018 CLINICAL DATA:  Right hip arthroplasty EXAM: OPERATIVE right  HIP (WITH PELVIS IF PERFORMED) 2 VIEWS TECHNIQUE: Fluoroscopic spot image(s) were submitted for interpretation post-operatively. COMPARISON:  None. FINDINGS: Two fluoroscopic images were obtained. The total fluoroscopy time was 37 seconds. The patient has undergone total hip arthroplasty on the right. The alignment appears near anatomic. IMPRESSION: Status post total hip arthroplasty on the right. Electronically Signed   By: Constance Holster M.D.   On: 12/30/2018 16:21    Assessment/Plan: 1 Day Post-Op Procedure(s) (LRB): RIGHT TOTAL HIP ARTHROPLASTY-DIRECT ANTERIOR (Right) Up with therapy. She is anxious to go home in AM  Julia Avery 12/31/2018, 7:45 AM

## 2018-12-31 NOTE — Progress Notes (Signed)
Orthopedic Tech Progress Note Patient Details:  Julia Avery 07/17/1956 [id number] RN said patient has been doing great today up in the chair and that she wouldn't need a trapeze Patient ID: Kaaliyah Kita, female   DOB: 1956-03-24, 63 y.o.   MRN: [id number]   Janit Pagan 12/31/2018, 4:55 PM

## 2018-12-31 NOTE — Anesthesia Postprocedure Evaluation (Signed)
Anesthesia Post Note  Patient: Julia Avery  Procedure(s) Performed: RIGHT TOTAL HIP ARTHROPLASTY-DIRECT ANTERIOR (Right Hip)     Patient location during evaluation: PACU Anesthesia Type: General Level of consciousness: awake and alert Pain management: pain level controlled Vital Signs Assessment: post-procedure vital signs reviewed and stable Respiratory status: spontaneous breathing, nonlabored ventilation and respiratory function stable Cardiovascular status: blood pressure returned to baseline and stable Postop Assessment: no apparent nausea or vomiting Anesthetic complications: no    Last Vitals:  Vitals:   12/30/18 2359 12/31/18 0352  BP: (!) 147/82 (!) 145/73  Pulse: 75 76  Resp: 17 16  Temp: 37.3 C 37.1 C  SpO2: 98% 98%    Last Pain:  Vitals:   12/31/18 0352  TempSrc: Oral  PainSc:    Pain Goal:                   Lynda Rainwater

## 2018-12-31 NOTE — Progress Notes (Signed)
The patient's contact person Lelon Frohlich) was given an update on plan of care and progress.

## 2019-01-01 DIAGNOSIS — M1611 Unilateral primary osteoarthritis, right hip: Secondary | ICD-10-CM | POA: Diagnosis not present

## 2019-01-01 LAB — CBC
HCT: 28.1 % — ABNORMAL LOW (ref 36.0–46.0)
Hemoglobin: 9.2 g/dL — ABNORMAL LOW (ref 12.0–15.0)
MCH: 29.9 pg (ref 26.0–34.0)
MCHC: 32.7 g/dL (ref 30.0–36.0)
MCV: 91.2 fL (ref 80.0–100.0)
Platelets: 278 10*3/uL (ref 150–400)
RBC: 3.08 MIL/uL — ABNORMAL LOW (ref 3.87–5.11)
RDW: 15.9 % — ABNORMAL HIGH (ref 11.5–15.5)
WBC: 11.3 10*3/uL — ABNORMAL HIGH (ref 4.0–10.5)
nRBC: 0 % (ref 0.0–0.2)

## 2019-01-01 MED ORDER — OXYCODONE-ACETAMINOPHEN 7.5-325 MG PO TABS: 1.0000 | ORAL_TABLET | Freq: Four times a day (QID) | ORAL | 0 refills | Status: DC | PRN

## 2019-01-01 MED ORDER — METHOCARBAMOL 500 MG PO TABS: 500.0000 mg | ORAL_TABLET | Freq: Four times a day (QID) | ORAL | 0 refills | Status: DC | PRN

## 2019-01-01 MED ORDER — ASPIRIN 325 MG PO TBEC: 325.0000 mg | DELAYED_RELEASE_TABLET | Freq: Every day | ORAL | 0 refills | Status: DC

## 2019-01-01 NOTE — Progress Notes (Signed)
Physical Therapy Treatment Patient Details Name: Julia Avery MRN: [id number] DOB: 20-Dec-1955 Today's Date: 01/01/2019    History of Present Illness Pt is a 63 y.o. female s/p elective R THA (direct anterior approach) on 12/30/18. PMH includes obesity, HTN, back pain, arthritis.   PT Comments    Pt limited by increased RLE pain this session, including a burning sensation around knee. Able to transfer and ambulate short distance with RW and min guard, but barely tolerating WB through RLE. Despite this, pt able to perform standing and seated ADLs with minA. Pt will have assist from son and brother upon return home. Will continue to follow acutely.   Follow Up Recommendations  Home health PT;Follow surgeon's recommendation for DC plan and follow-up therapies;Supervision for mobility/OOB     Equipment Recommendations  Rolling walker with 5" wheels;3in1 (PT)    Recommendations for Other Services       Precautions / Restrictions Precautions Precautions: Fall Restrictions Weight Bearing Restrictions: Yes RLE Weight Bearing: Weight bearing as tolerated    Mobility  Bed Mobility Overal bed mobility: Needs Assistance Bed Mobility: Supine to Sit     Supine to sit: Mod assist     General bed mobility comments: ModA to assist RLE to EOB. Once partially seated, able to scoot to EOB indep  Transfers Overall transfer level: Needs assistance Equipment used: Rolling walker (2 wheeled) Transfers: Sit to/from Stand Sit to Stand: Min guard         General transfer comment: Pt limited by pain, reliant on momentum to power into standing; min guard for safety  Ambulation/Gait Ambulation/Gait assistance: Min guard Gait Distance (Feet): 10 Feet Assistive device: Rolling walker (2 wheeled) Gait Pattern/deviations: Step-through pattern;Decreased stride length;Decreased weight shift to right;Antalgic Gait velocity: Decreased Gait velocity interpretation: <1.31 ft/sec, indicative  of household ambulator General Gait Details: Slow, antalgic gait with RW; decreased WBAT through RLE as pt with increased pain. Urine incontinence upon sitting/standing   Stairs             Wheelchair Mobility    Modified Rankin (Stroke Patients Only)       Balance Overall balance assessment: Needs assistance   Sitting balance-Leahy Scale: Fair       Standing balance-Leahy Scale: Fair Standing balance comment: Can static stand without UE support; dynamic stability improved with single UE support on RW while performing pericare and washing legs                            Cognition Arousal/Alertness: Awake/alert Behavior During Therapy: WFL for tasks assessed/performed Overall Cognitive Status: Within Functional Limits for tasks assessed                                        Exercises Total Joint Exercises Ankle Circles/Pumps: AROM;Both Heel Slides: AAROM;Right;Seated(pillowcase under foot) Marching in Standing: AAROM;Right;Seated(BUE support) General Exercises - Lower Extremity Toe Raises: AROM;Right;Seated Heel Raises: AROM;Right;Seated    General Comments        Pertinent Vitals/Pain Pain Assessment: Faces Faces Pain Scale: Hurts even more Pain Location: R hip and knee Pain Descriptors / Indicators: Sore;Tightness Pain Intervention(s): Monitored during session;Limited activity within patient's tolerance    Home Living                      Prior Function  PT Goals (current goals can now be found in the care plan section) Acute Rehab PT Goals Patient Stated Goal: Return home; find cell phone PT Goal Formulation: With patient Time For Goal Achievement: 01/14/19 Potential to Achieve Goals: Good Progress towards PT goals: Progressing toward goals    Frequency    7X/week      PT Plan Current plan remains appropriate    Co-evaluation              AM-PAC PT "6 Clicks" Mobility   Outcome  Measure  Help needed turning from your back to your side while in a flat bed without using bedrails?: A Lot Help needed moving from lying on your back to sitting on the side of a flat bed without using bedrails?: A Little Help needed moving to and from a bed to a chair (including a wheelchair)?: A Little Help needed standing up from a chair using your arms (e.g., wheelchair or bedside chair)?: A Little Help needed to walk in hospital room?: A Little Help needed climbing 3-5 steps with a railing? : A Little 6 Click Score: 17    End of Session   Activity Tolerance: Patient limited by pain Patient left: in chair;with call bell/phone within reach Nurse Communication: Mobility status PT Visit Diagnosis: Other abnormalities of gait and mobility (R26.89);Pain Pain - Right/Left: Right Pain - part of body: Hip     Time: 8366-2947 PT Time Calculation (min) (ACUTE ONLY): 27 min  Charges:  $Therapeutic Exercise: 8-22 mins $Therapeutic Activity: 8-22 mins                    Mabeline Caras, PT, DPT Acute Rehabilitation Services  Pager [phone] Office [phone]  Derry Lory 01/01/2019, 9:56 AM

## 2019-01-01 NOTE — Discharge Instructions (Signed)
INSTRUCTIONS AFTER TOTAL HIP JOINT REPLACEMENT   o Remove items at home which could result in a fall. This includes throw rugs or furniture in walking pathways o ICE to the affected joint every three hours while awake for 30 minutes at a time, for at least the first 3-5 days, and then as needed for pain and swelling.  Continue to use ice for pain and swelling. You may notice swelling that will progress down to the foot and ankle.  This is normal after surgery.  Elevate your leg when you are not up walking on it.   o Continue to use the breathing machine you got in the hospital (incentive spirometer) which will help keep your temperature down.  It is common for your temperature to cycle up and down following surgery, especially at night when you are not up moving around and exerting yourself.  The breathing machine keeps your lungs expanded and your temperature down.   DIET:  As you were doing prior to hospitalization, we recommend a well-balanced diet.  DRESSING / WOUND CARE / SHOWERING  Ok to shower without dressing but no tub soaking.  Daily dressing changes with gauze and tape.  Do not apply any creams or ointments to incision.  Contact our office immediately if any questions regarding your wound.   ACTIVITY  o Increase activity slowly as tolerated, but follow the weight bearing instructions below.   o No driving for 6 weeks or until further direction given by your physician.  You cannot drive while taking narcotics.  o No lifting or carrying greater than 10 lbs. until further directed by your surgeon. o Avoid periods of inactivity such as sitting longer than an hour when not asleep. This helps prevent blood clots.  o You may return to work once you are authorized by your doctor.     WEIGHT BEARING   Weight bearing as tolerated with assist device (walker, cane, etc) as directed, use it as long as suggested by your surgeon or therapist, typically at least 4-6  weeks.   EXERCISES  Results after joint replacement surgery are often greatly improved when you follow the exercise, range of motion and muscle strengthening exercises prescribed by your doctor. Safety measures are also important to protect the joint from further injury. Any time any of these exercises cause you to have increased pain or swelling, decrease what you are doing until you are comfortable again and then slowly increase them. If you have problems or questions, call your caregiver or physical therapist for advice.   Rehabilitation is important following a joint replacement. After just a few days of immobilization, the muscles of the leg can become weakened and shrink (atrophy).  These exercises are designed to build up the tone and strength of the thigh and leg muscles and to improve motion. Often times heat used for twenty to thirty minutes before working out will loosen up your tissues and help with improving the range of motion but do not use heat for the first two weeks following surgery (sometimes heat can increase post-operative swelling).   These exercises can be done on a training (exercise) mat, on the floor, on a table or on a bed. Use whatever works the best and is most comfortable for you.    Use music or television while you are exercising so that the exercises are a pleasant break in your day. This will make your life better with the exercises acting as a break in your routine that you  can look forward to.   Perform all exercises about fifteen times, three times per day or as directed.  You should exercise both the operative leg and the other leg as well.  Exercises include:    Quad Sets - Tighten up the muscle on the front of the thigh (Quad) and hold for 5-10 seconds.    Straight Leg Raises - With your knee straight (if you were given a brace, keep it on), lift the leg to 60 degrees, hold for 3 seconds, and slowly lower the leg.  Perform this exercise against resistance  later as your leg gets stronger.   Leg Slides: Lying on your back, slowly slide your foot toward your buttocks, bending your knee up off the floor (only go as far as is comfortable). Then slowly slide your foot back down until your leg is flat on the floor again.   Angel Wings: Lying on your back spread your legs to the side as far apart as you can without causing discomfort.   Hamstring Strength:  Lying on your back, push your heel against the floor with your leg straight by tightening up the muscles of your buttocks.  Repeat, but this time bend your knee to a comfortable angle, and push your heel against the floor.  You may put a pillow under the heel to make it more comfortable if necessary.   A rehabilitation program following joint replacement surgery can speed recovery and prevent re-injury in the future due to weakened muscles. Contact your doctor or a physical therapist for more information on knee rehabilitation.    CONSTIPATION  Constipation is defined medically as fewer than three stools per week and severe constipation as less than one stool per week.  Even if you have a regular bowel pattern at home, your normal regimen is likely to be disrupted due to multiple reasons following surgery.  Combination of anesthesia, postoperative narcotics, change in appetite and fluid intake all can affect your bowels.   YOU MUST use at least one of the following options; they are listed in order of increasing strength to get the job done.  They are all available over the counter, and you may need to use some, POSSIBLY even all of these options:    Drink plenty of fluids (prune juice may be helpful) and high fiber foods Colace 100 mg by mouth twice a day  Senokot for constipation as directed and as needed Dulcolax (bisacodyl), take with full glass of water  Miralax (polyethylene glycol) once or twice a day as needed.  If you have tried all these things and are unable to have a bowel movement in the  first 3-4 days after surgery call either your surgeon or your primary doctor.    If you experience loose stools or diarrhea, hold the medications until you stool forms back up.  If your symptoms do not get better within 1 week or if they get worse, check with your doctor.  If you experience "the worst abdominal pain ever" or develop nausea or vomiting, please contact the office immediately for further recommendations for treatment.   ITCHING:  If you experience itching with your medications, try taking only a single pain pill, or even half a pain pill at a time.  You can also use Benadryl over the counter for itching or also to help with sleep.   TED HOSE STOCKINGS:  Use stockings on both legs until for at least 2 weeks or as directed by physician office. They  may be removed at night for sleeping.  MEDICATIONS:  See your medication summary on the After Visit Summary that nursing will review with you.  You may have some home medications which will be placed on hold until you complete the course of blood thinner medication.  It is important for you to complete the blood thinner medication as prescribed.  PRECAUTIONS:  If you experience chest pain or shortness of breath - call 911 immediately for transfer to the hospital emergency department.   If you develop a fever greater that 101 F, purulent drainage from wound, increased redness or drainage from wound, foul odor from the wound/dressing, or calf pain - CONTACT YOUR SURGEON.                                                   FOLLOW-UP APPOINTMENTS:  If you do not already have a post-op appointment, please call the office for an appointment to be seen by your surgeon.  Guidelines for how soon to be seen are listed in your After Visit Summary, but are typically between 1-4 weeks after surgery.  OTHER INSTRUCTIONS:   Knee Replacement:  Do not place pillow under knee, focus on keeping the knee straight while resting. CPM instructions: 0-90 degrees,  2 hours in the morning, 2 hours in the afternoon, and 2 hours in the evening. Place foam block, curve side up under heel at all times except when in CPM or when walking.  DO NOT modify, tear, cut, or change the foam block in any way.  MAKE SURE YOU:   Understand these instructions.   Get help right away if you are not doing well or get worse.    Thank you for letting us be a part of your medical care team.  It is a privilege we respect greatly.  We hope these instructions will help you stay on track for a fast and full recovery!

## 2019-01-01 NOTE — Progress Notes (Signed)
Discharge education / instructions addressed;Pt not in distress;patient picked up by family by the [employer].

## 2019-01-01 NOTE — TOC Transition Note (Signed)
Transition of Care [facility]) - CM/SW Discharge Note   Patient Details  Name: Julia Avery MRN: [id number] Date of Birth: November 07, 1955  Transition of Care [facility] CM/SW Contact:  Sharin Mons, RN Phone Number: 01/01/2019, 2:12 PM   Clinical Narrative:   Pt s/p elective R THA on 12/30/18. PMH includes obesity, HTN, back pain, arthritis. From home alone , however states family will assist with needs once d/c.  Pt will transition to home today. Home health services [place] with 24-48 hrs.Rolling walker and BSC will be delivered to bedside prior to d/c.   PCP: Idamae Lusher  Final next level of care: [place] Barriers to Discharge: Barriers Resolved   Patient Goals and CMS Choice        Discharge Placement                       Discharge Plan and Services   Discharge Planning Services: CM Consult            DME Arranged: 3-N-1, Walker rolling DME Agency: [employer]   Time DME Agency Contacted: [phone] Representative spoke with at DME Agency: [place]: PT   Date [place]: 01/01/19 Time [place]: 1330 Representative spoke with at [place]: [place] Determinants of Health ([place]) Interventions     Readmission Risk Interventions No flowsheet data found.

## 2019-01-01 NOTE — Progress Notes (Signed)
Subjective: Patient complaining of "burning" right thigh pain.  States that she has severe thigh pain when she is ambulating and only able to do  touchdown weightbearing.  Denies calf pain.  No complaints of chest pain shortness of breath.  She has ambulated in the hall and completed stair training. Patient states that her phone has been lost since this hospital admission.  It has been looked for in the laundry but not yet recovered.  Objective: Vital signs in last 24 hours: Temp:  [98.5 F (36.9 C)-98.9 F (37.2 C)] 98.5 F (36.9 C) (06/17 0803) Pulse Rate:  [76-93] 93 (06/17 0803) Resp:  [16-18] 18 (06/17 0803) BP: (117-145)/(73-84) 137/73 (06/17 0803) SpO2:  [97 %-100 %] 97 % (06/17 0803)  Intake/Output from previous day: 06/16 0701 - 06/17 0700 In: 1200 [P.O.:1200] Out: 500 [Urine:500] Intake/Output this shift: Total I/O In: 240 [P.O.:240] Out: 300 [Urine:300]  Recent Labs    12/31/18 0527 01/01/19 0729  HGB 10.4* 9.2*   Recent Labs    12/31/18 0527 01/01/19 0729  WBC 11.6* 11.3*  RBC 3.53* 3.08*  HCT 32.4* 28.1*  PLT 332 278   Recent Labs    12/31/18 0527  NA 140  K 3.8  CL 109  CO2 24  BUN 12  CREATININE 0.70  GLUCOSE 149*  CALCIUM 8.5*   No results for input(s): LABPT, INR in the last 72 hours.  Exam Pleasant white female alert and oriented in no acute distress.  Right hip incision looks good.  Staples intact.  No drainage or signs infection.  Patient does have moderate right thigh swelling.  Thigh is soft but diffusely tender.  Right calf nontender.  Neuro vas intact.  Right anterior tib, gastroc and EHL intact.    Assessment/Plan: Spoke with Dr. Lorin Mercy regarding patient's burning thigh pain.  Says that it is okay for patient to discharge home.  Patient being picked up by family members who live in [place].  She will use ice off and on as needed.  Prescription on chart for Percocet, Robaxin, aspirin.  Aspirin  at least 4 weeks postop for DVT  prophylaxis.  Follow-up 2 weeks postop.  Patient knows to contact us immediately if there are any questions or concerns before her office visit.     Benjiman Core 01/01/2019, 10:08 AM

## 2019-01-01 NOTE — Progress Notes (Signed)
Physical Therapy Treatment Patient Details Name: Julia Avery MRN: [id number] DOB: Feb 18, 1956 Today's Date: 01/01/2019    History of Present Illness Pt is a 63 y.o. female s/p elective R THA (direct anterior approach) on 12/30/18. PMH includes obesity, HTN, back pain, arthritis.   PT Comments    Pt slowly progressing with mobility. Preparing for d/c home this afternoon with assistance from family. Pt  Currently able to ambulate household distances with RW and ascend/descend steps into home, although significant limited by RLE pain with minimal WB. Reviewed LE therex/ROM, assist needed from family (including ADL tasks), DME use and fall risk reduction. If to remain admitted, will continue to follow acutely.   Follow Up Recommendations  Home health PT;Follow surgeon's recommendation for DC plan and follow-up therapies;Supervision for mobility/OOB     Equipment Recommendations  Rolling walker with 5" wheels;3in1 (PT)    Recommendations for Other Services       Precautions / Restrictions Precautions Precautions: Fall Restrictions Weight Bearing Restrictions: Yes RLE Weight Bearing: Weight bearing as tolerated    Mobility  Bed Mobility Overal bed mobility: Needs Assistance Bed Mobility: Sit to Supine     Supine to sit: Mod assist Sit to supine: Mod assist   General bed mobility comments: ModA to assist RLE to supine; pt unable to perform AAROM with blanket/sheet to assist  Transfers Overall transfer level: Needs assistance Equipment used: Rolling walker (2 wheeled) Transfers: Sit to/from Stand Sit to Stand: Min guard         General transfer comment: Pt limited by pain, reliant on momentum to power into standing; min guard for safety  Ambulation/Gait Ambulation/Gait assistance: Min guard Gait Distance (Feet): 60 Feet Assistive device: Rolling walker (2 wheeled) Gait Pattern/deviations: Step-through pattern;Decreased stride length;Decreased weight shift to  right;Antalgic Gait velocity: Decreased Gait velocity interpretation: <1.31 ft/sec, indicative of household ambulator General Gait Details: Slow, antalgic gait with RW; minimal WB through RLE due to pain   Stairs             Wheelchair Mobility    Modified Rankin (Stroke Patients Only)       Balance Overall balance assessment: Needs assistance   Sitting balance-Leahy Scale: Fair       Standing balance-Leahy Scale: Fair Standing balance comment: Can static stand without UE support; dynamic stability improved with UE support                            Cognition Arousal/Alertness: Awake/alert Behavior During Therapy: WFL for tasks assessed/performed Overall Cognitive Status: Within Functional Limits for tasks assessed                                        Exercises Total Joint Exercises Ankle Circles/Pumps: AROM;Both Heel Slides: AAROM;Right;Seated(pillowcase under foot) Marching in Standing: AAROM;Right;Seated(BUE support) General Exercises - Lower Extremity Toe Raises: AROM;Right;Seated Heel Raises: AROM;Right;Seated    General Comments        Pertinent Vitals/Pain Pain Assessment: Faces Faces Pain Scale: Hurts whole lot Pain Location: R hip and knee Pain Descriptors / Indicators: Sore;Tightness Pain Intervention(s): Monitored during session    Home Living                      Prior Function            PT Goals (current goals can now be found  in the care plan section) Acute Rehab PT Goals Patient Stated Goal: Return home; find cell phone PT Goal Formulation: With patient Time For Goal Achievement: 01/14/19 Potential to Achieve Goals: Good Progress towards PT goals: Progressing toward goals    Frequency    7X/week      PT Plan Current plan remains appropriate    Co-evaluation              AM-PAC PT "6 Clicks" Mobility   Outcome Measure  Help needed turning from your back to your side while  in a flat bed without using bedrails?: A Little Help needed moving from lying on your back to sitting on the side of a flat bed without using bedrails?: A Lot Help needed moving to and from a bed to a chair (including a wheelchair)?: A Little Help needed standing up from a chair using your arms (e.g., wheelchair or bedside chair)?: A Little Help needed to walk in hospital room?: A Little Help needed climbing 3-5 steps with a railing? : A Little 6 Click Score: 17    End of Session Equipment Utilized During Treatment: Gait belt Activity Tolerance: Patient limited by pain Patient left: in bed;with call bell/phone within reach Nurse Communication: Mobility status PT Visit Diagnosis: Other abnormalities of gait and mobility (R26.89);Pain Pain - Right/Left: Right Pain - part of body: Hip     Time: 6160-7371 PT Time Calculation (min) (ACUTE ONLY): 18 min  Charges:  $Gait Training: 8-22 mins                    Mabeline Caras, PT, DPT Acute Rehabilitation Services  Pager [phone] Office [place] 01/01/2019, 1:28 PM

## 2019-01-06 ENCOUNTER — Telehealth: Payer: Self-pay | Admitting: Orthopaedic Surgery

## 2019-01-06 NOTE — Telephone Encounter (Signed)
I left voicemail for Joey advising.

## 2019-01-06 NOTE — Telephone Encounter (Signed)
OK - thanks

## 2019-01-06 NOTE — Telephone Encounter (Signed)
Received call from Julia Avery with [facility] needing plan of care orders for HHPT 1 Wk 1 and 2 Wk 2. The number to contact Joey is [phone]

## 2019-01-06 NOTE — Telephone Encounter (Signed)
Please advise 

## 2019-01-09 NOTE — Discharge Summary (Signed)
Patient ID: Julia Avery MRN: [id number] DOB/AGE: [birth year] 63 y.o.  Admit date: 12/30/2018 Discharge date: 01/09/2019  Admission Diagnoses:  Active Problems:   Unilateral primary osteoarthritis, right hip   Arthritis of right hip   Discharge Diagnoses:  Active Problems:   Unilateral primary osteoarthritis, right hip   Arthritis of right hip  status post Procedure(s): RIGHT TOTAL HIP ARTHROPLASTY-DIRECT ANTERIOR  Past Medical History:  Diagnosis Date  . Arthritis   . Back pain   . Hypertension   . Obesity, Class III, BMI 40-49.9 (morbid obesity) (HCC)     Surgeries: Procedure(s): RIGHT TOTAL HIP ARTHROPLASTY-DIRECT ANTERIOR on 12/30/2018   Consultants:   Discharged Condition: Improved  Hospital Course: Aneita Kiger is an 63 y.o. female who was admitted 12/30/2018 for operative treatment of right hip arthritis. Patient failed conservative treatments (please see the history and physical for the specifics) and had severe unremitting pain that affects sleep, daily activities and work/hobbies. After pre-op clearance, the patient was taken to the operating room on 12/30/2018 and underwent  Procedure(s): RIGHT TOTAL HIP ARTHROPLASTY-DIRECT ANTERIOR.    Patient was given perioperative antibiotics:  Anti-infectives (From admission, onward)   Start     Dose/Rate Route Frequency Ordered Stop   12/30/18 1800  vancomycin (VANCOCIN) IVPB 1000 mg/200 mL premix     1,000 mg 200 mL/hr over 60 Minutes Intravenous Every 12 hours 12/30/18 1754 12/30/18 1943   12/30/18 1215  vancomycin (VANCOCIN) IVPB 1000 mg/200 mL premix  Status:  Discontinued     1,000 mg 200 mL/hr over 60 Minutes Intravenous On call to O.R. 12/30/18 1200 12/30/18 1206   12/30/18 1215  vancomycin (VANCOCIN) 1,500 mg in sodium chloride 0.9 % 500 mL IVPB     1,500 mg 250 mL/hr over 120 Minutes Intravenous To [facility]Stay [facility] 12/30/18 1207 12/30/18 1415   12/30/18 1205  vancomycin (VANCOCIN) 1-5  GM/200ML-% IVPB    Note to Pharmacy: Cordelia Pen   : cabinet override      12/30/18 1205 12/30/18 1840       Patient was given sequential compression devices and early ambulation to prevent DVT.   Patient benefited maximally from hospital stay and there were no complications. At the time of discharge, the patient was urinating/moving their bowels without difficulty, tolerating a regular diet, pain is controlled with oral pain medications and they have been cleared by PT/OT.   Recent vital signs: No data found.   Recent laboratory studies: No results for input(s): WBC, HGB, HCT, PLT, NA, K, CL, CO2, BUN, CREATININE, GLUCOSE, INR, CALCIUM in the last 72 hours.  Invalid input(s): PT, 2   Discharge Medications:   Allergies as of 01/01/2019      Reactions   Penicillins Other (See Comments)   UNSPECIFIED REACTION  Did it involve swelling of the face/tongue/throat, SOB, or low BP? Unknown Did it involve sudden or severe rash/hives, skin peeling, or any reaction on the inside of your mouth or nose? Unknown Did you need to seek medical attention at a hospital or doctor's office? Unknown When did it last happen?Childhood reaction, never taken since If all above answers are "NO", may proceed with cephalosporin use.'   Tessalon Perles [benzonatate] Nausea Only   Tramadol Rash      Medication List    STOP taking these medications   meloxicam 15 MG tablet Commonly known as: MOBIC   nicotine 14 mg/24hr patch Commonly known as: Nicoderm CQ   oxyCODONE-acetaminophen 10-325 MG tablet Commonly known as: PERCOCET Replaced  by: oxyCODONE-acetaminophen 7.5-325 MG tablet     TAKE these medications   albuterol 108 (90 Base) MCG/ACT inhaler Commonly known as: VENTOLIN HFA Inhale 2 puffs into the lungs every 4 (four) hours as needed for wheezing or shortness of breath.   amLODipine 10 MG tablet Commonly known as: NORVASC Take 1 tablet (10 mg total) by mouth daily.   aspirin 325  MG EC tablet Take 1 tablet (325 mg total) by mouth daily with breakfast.   methocarbamol 500 MG tablet Commonly known as: ROBAXIN Take 1 tablet (500 mg total) by mouth every 6 (six) hours as needed for muscle spasms.   oxyCODONE-acetaminophen 7.5-325 MG tablet Commonly known as: Percocet Take 1 tablet by mouth every 6 (six) hours as needed for severe pain. Replaces: oxyCODONE-acetaminophen 10-325 MG tablet   Stool Softener 100 MG capsule Generic drug: docusate sodium Take 100 mg by mouth daily.       Diagnostic Studies: Dg Chest 2 View  Result Date: 12/26/2018 CLINICAL DATA:  Preoperative respiratory evaluation for hip replacement. EXAM: CHEST - 2 VIEW COMPARISON:  08/22/2017 FINDINGS: The lungs are clear without focal pneumonia, edema, pneumothorax or pleural effusion. The cardiopericardial silhouette is within normal limits for size. The visualized bony structures of the thorax are intact. IMPRESSION: Stable.  No acute findings. Electronically Signed   By: Misty Stanley M.D.   On: 12/26/2018 20:56   Dg C-arm 1-60 Min  Result Date: 12/30/2018 CLINICAL DATA:  Hip replacement EXAM: DG C-ARM 61-120 MIN COMPARISON:  None. FINDINGS: Two intraoperative images were obtained. The patient has undergone total hip arthroplasty on the right. The alignment is near anatomic. Total fluoroscopy time was 37 seconds. IMPRESSION: Status post ORIF of the right hip. Electronically Signed   By: Constance Holster M.D.   On: 12/30/2018 16:20   Dg Hip Port Unilat With Pelvis 1v Right  Result Date: 12/30/2018 CLINICAL DATA:  Right hip replacement EXAM: DG HIP (WITH OR WITHOUT PELVIS) 1V PORT RIGHT COMPARISON:  None. FINDINGS: Satisfactory right hip replacement.  No fracture or complication. IMPRESSION: Satisfactory right hip replacement. Electronically Signed   By: Franchot Gallo M.D.   On: 12/30/2018 17:18   Dg Hip Operative Unilat W Or W/o Pelvis Right  Result Date: 12/30/2018 CLINICAL DATA:  Right hip  arthroplasty EXAM: OPERATIVE right HIP (WITH PELVIS IF PERFORMED) 2 VIEWS TECHNIQUE: Fluoroscopic spot image(s) were submitted for interpretation post-operatively. COMPARISON:  None. FINDINGS: Two fluoroscopic images were obtained. The total fluoroscopy time was 37 seconds. The patient has undergone total hip arthroplasty on the right. The alignment appears near anatomic. IMPRESSION: Status post total hip arthroplasty on the right. Electronically Signed   By: Constance Holster M.D.   On: 12/30/2018 16:21      Follow-up Information    Marybelle Killings, MD. Schedule an appointment as soon as possible for a visit today.   Specialty: Orthopedic Surgery Why: need return office visit 2 weeks postop Contact information: [place] [address] [phone]        [facility] up.   Specialty: Home Health Services Why: home health services arranged Contact information: [address] [place] [address] [phone]           Discharge Plan:  discharge to home  Disposition:     Signed: Benjiman Core for Rodell Perna MD 01/09/2019, 11:00 AM

## 2019-01-14 ENCOUNTER — Ambulatory Visit (INDEPENDENT_AMBULATORY_CARE_PROVIDER_SITE_OTHER): Payer: BC Managed Care – PPO

## 2019-01-14 ENCOUNTER — Ambulatory Visit (INDEPENDENT_AMBULATORY_CARE_PROVIDER_SITE_OTHER): Payer: BC Managed Care – PPO | Admitting: Orthopaedic Surgery

## 2019-01-14 ENCOUNTER — Inpatient Hospital Stay: Payer: BC Managed Care – PPO | Admitting: Orthopaedic Surgery

## 2019-01-14 ENCOUNTER — Encounter: Payer: Self-pay | Admitting: Orthopaedic Surgery

## 2019-01-14 ENCOUNTER — Other Ambulatory Visit: Payer: Self-pay

## 2019-01-14 VITALS — Ht 63.0 in | Wt 240.0 lb

## 2019-01-14 DIAGNOSIS — Z96641 Presence of right artificial hip joint: Secondary | ICD-10-CM
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
## 2019-01-16 ENCOUNTER — Inpatient Hospital Stay: Payer: BC Managed Care – PPO | Admitting: Orthopaedic Surgery

## 2019-01-28 ENCOUNTER — Ambulatory Visit: Payer: BC Managed Care – PPO | Admitting: Orthopaedic Surgery

## 2019-01-30 ENCOUNTER — Ambulatory Visit (INDEPENDENT_AMBULATORY_CARE_PROVIDER_SITE_OTHER): Payer: BC Managed Care – PPO | Admitting: Orthopaedic Surgery

## 2019-01-30 ENCOUNTER — Encounter: Payer: Self-pay | Admitting: Orthopaedic Surgery

## 2019-01-30 ENCOUNTER — Other Ambulatory Visit: Payer: Self-pay

## 2019-01-30 DIAGNOSIS — Z96649 Presence of unspecified artificial hip joint: Secondary | ICD-10-CM | POA: Insufficient documentation

## 2019-01-30 DIAGNOSIS — Z96641 Presence of right artificial hip joint: Secondary | ICD-10-CM
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
## 2019-02-11 ENCOUNTER — Other Ambulatory Visit: Payer: Self-pay | Admitting: Family Medicine

## 2019-02-27 ENCOUNTER — Ambulatory Visit: Payer: BC Managed Care – PPO | Admitting: Orthopaedic Surgery

## 2019-02-27 ENCOUNTER — Other Ambulatory Visit: Payer: Self-pay

## 2019-03-06 ENCOUNTER — Other Ambulatory Visit: Payer: Self-pay

## 2019-03-06 ENCOUNTER — Ambulatory Visit (INDEPENDENT_AMBULATORY_CARE_PROVIDER_SITE_OTHER): Payer: BC Managed Care – PPO | Admitting: Orthopaedic Surgery

## 2019-03-06 VITALS — BP 163/108 | Ht 63.0 in | Wt 230.0 lb

## 2019-03-06 DIAGNOSIS — Z96641 Presence of right artificial hip joint: Secondary | ICD-10-CM | POA: Diagnosis not present
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
## 2019-03-07 ENCOUNTER — Encounter: Payer: Self-pay | Admitting: Orthopaedic Surgery

## 2019-04-09 ENCOUNTER — Encounter: Payer: BLUE CROSS/BLUE SHIELD | Admitting: Family Medicine

## 2019-04-14 ENCOUNTER — Other Ambulatory Visit: Payer: Self-pay | Admitting: Family Medicine

## 2019-04-15 ENCOUNTER — Telehealth: Payer: Self-pay

## 2019-04-15 NOTE — Telephone Encounter (Signed)
Estill Bamberg with Dr[facility] would like a note/letter faxed stating that patient does not require pre-medication.  Fax# is [phone].  Cb# is [phone].  Please advise.  Thank you.

## 2019-04-16 NOTE — Telephone Encounter (Signed)
Was not sure about this since patient just had surgery in June. Typically Dr Marlou Sa does not want patient doing dental work for the first 3-4 months after surgery. I know that Dr Lorin Mercy does pre-med a little differently. Holding for you.

## 2019-04-17 NOTE — Telephone Encounter (Signed)
faxed

## 2019-04-24 ENCOUNTER — Other Ambulatory Visit (HOSPITAL_BASED_OUTPATIENT_CLINIC_OR_DEPARTMENT_OTHER): Payer: Self-pay

## 2019-04-24 DIAGNOSIS — G4709 Other insomnia: Secondary | ICD-10-CM

## 2019-05-02 ENCOUNTER — Encounter: Payer: Self-pay | Admitting: Radiology

## 2019-05-02 ENCOUNTER — Telehealth: Payer: Self-pay | Admitting: Radiology

## 2019-05-02 NOTE — Telephone Encounter (Signed)
Amanda from Dr. Nyra Capes, dental off called to check if patient requires pre-meds.  Advised Dr. Lorin Mercy does not require pre-meds  Will fax note to [phone]

## 2019-05-09 ENCOUNTER — Encounter: Payer: Self-pay | Admitting: Family Medicine

## 2019-05-09 ENCOUNTER — Ambulatory Visit (INDEPENDENT_AMBULATORY_CARE_PROVIDER_SITE_OTHER): Payer: BC Managed Care – PPO | Admitting: Family Medicine

## 2019-05-09 ENCOUNTER — Ambulatory Visit: Payer: BC Managed Care – PPO

## 2019-05-09 ENCOUNTER — Other Ambulatory Visit: Payer: Self-pay

## 2019-05-09 VITALS — BP 132/80 | HR 72 | Temp 98.6°F | Resp 14 | Ht 63.0 in | Wt 248.0 lb

## 2019-05-09 DIAGNOSIS — Z0001 Encounter for general adult medical examination with abnormal findings: Secondary | ICD-10-CM | POA: Diagnosis not present

## 2019-05-09 DIAGNOSIS — R7301 Impaired fasting glucose: Secondary | ICD-10-CM | POA: Diagnosis not present

## 2019-05-09 DIAGNOSIS — L900 Lichen sclerosus et atrophicus: Secondary | ICD-10-CM

## 2019-05-09 DIAGNOSIS — Z0000 Encounter for general adult medical examination without abnormal findings: Secondary | ICD-10-CM

## 2019-05-09 DIAGNOSIS — I10 Essential (primary) hypertension: Secondary | ICD-10-CM

## 2019-05-09 MED ORDER — CLOTRIMAZOLE-BETAMETHASONE 1-0.05 % EX CREA: 1.0000 "application " | TOPICAL_CREAM | Freq: Two times a day (BID) | CUTANEOUS | 1 refills | Status: DC

## 2019-05-09 MED ORDER — MELOXICAM 15 MG PO TABS: 15.0000 mg | ORAL_TABLET | Freq: Every day | ORAL | 0 refills | Status: DC

## 2019-05-09 NOTE — Progress Notes (Signed)
   Subjective:    Patient ID: Julia Avery, female    DOB: [birth year], 63 y.o.   MRN: [id number]  Patient presents for Annual Exam (is fasting) and Vaginitis (itching and slight discharge)   Pt here for CPE  She had recent Right hip placement in June 2020. She is doing well.   She is back at work   She does have sorness in her leg    She has gained weight since our last visit   Taking Percocet twice day at most, taking meloxicam   HTN- taking BP meds as prescribed    Mammogram Due    Colonoscopy UTD / PAP UTD  Scheduled for home sleep apnea test tonight    Vaginal itching past few days poinst to just above clitoral hood, no discharge, no vaginal bleeding   has had itching episodes on and off, not sexually active > 1 year   pt did not wantuse of speculum  Taking Vitamin D 2000iu She is taking apple Cidar gummy   Has not needed inhaler   Has eye doctor and dentist    Review Of Systems:  GEN- denies fatigue, fever, weight loss,weakness, recent illness HEENT- denies eye drainage, change in vision, nasal discharge, CVS- denies chest pain, palpitations RESP- denies SOB, cough, wheeze ABD- denies N/V, change in stools, abd pain GU- denies dysuria, hematuria, dribbling, incontinence MSK- denies joint pain, muscle aches, injury Neuro- denies headache, dizziness, syncope, seizure activity       Objective:    BP 132/80   Pulse 72   Temp 98.6 F (37 C) (Oral)   Resp 14   Ht 5\' 3"  (1.6 m)   Wt 248 lb (112.5 kg)   SpO2 98%   BMI 43.93 kg/m  GEN- NAD, alert and oriented x3 HEENT- PERRL, EOMI, non injected sclera, pink conjunctiva, MMM, oropharynx clear Neck- Supple, no thyromegaly CVS- RRR, no murmur RESP-CTAB Breast- normal symmetry, no nipple inversion,no nipple drainage, no nodules or lumps felt Nodes- no axillary nodes ABD-NABS,soft,NT,ND GU- normal external genitalia scattered hypopigmentation of labia majora with some scarring appearing, no  discharge between folds ,  EXT- No edema Pulses- Radial, DP- 2+        Assessment & Plan:      Problem List Items Addressed This Visit      Unprioritized   Essential hypertension, benign    Well controlled       Morbid obesity ([place])    Discussed dietary changes, cut back on sweets, carbs, fast food       Other Visit Diagnoses    Routine general medical examination at a health care facility    -  Primary   Pt to schedule mammogram, no PAP needed UTD,   Relevant Orders   CBC with Differential/Platelet (Completed)   Comprehensive metabolic panel (Completed)   Lipid panel (Completed)   Elevated fasting glucose       Relevant Orders   Hemoglobin [id number] (Completed)   Lichen sclerosus       concern for chronic itching episodes with skin changes a sclerosus, yeast also possible, will try topical clobetasol/fungal combination to areas      Note: This dictation was prepared with Dragon dictation along with smaller phrase technology. Any transcriptional errors that result from this process are unintentional.

## 2019-05-09 NOTE — Patient Instructions (Addendum)
F/U 6 months  We will call with lab results  Use the topical cream twice a day

## 2019-05-10 ENCOUNTER — Encounter: Payer: Self-pay | Admitting: Family Medicine

## 2019-05-10 LAB — CBC WITH DIFFERENTIAL/PLATELET
Absolute Monocytes: 467 cells/uL (ref 200–950)
Basophils Absolute: 90 cells/uL (ref 0–200)
Basophils Relative: 1.1 %
Eosinophils Absolute: 312 cells/uL (ref 15–500)
Eosinophils Relative: 3.8 %
HCT: 36.3 % (ref 35.0–45.0)
Hemoglobin: 12 g/dL (ref 11.7–15.5)
Lymphs Abs: 3592 cells/uL (ref 850–3900)
MCH: 27.9 pg (ref 27.0–33.0)
MCHC: 33.1 g/dL (ref 32.0–36.0)
MCV: 84.4 fL (ref 80.0–100.0)
MPV: 10.7 fL (ref 7.5–12.5)
Monocytes Relative: 5.7 %
Neutro Abs: 3739 cells/uL (ref 1500–7800)
Neutrophils Relative %: 45.6 %
Platelets: 445 10*3/uL — ABNORMAL HIGH (ref 140–400)
RBC: 4.3 10*6/uL (ref 3.80–5.10)
RDW: 15.7 % — ABNORMAL HIGH (ref 11.0–15.0)
Total Lymphocyte: 43.8 %
WBC: 8.2 10*3/uL (ref 3.8–10.8)

## 2019-05-10 LAB — COMPREHENSIVE METABOLIC PANEL
AG Ratio: 1.5 (calc) (ref 1.0–2.5)
ALT: 16 U/L (ref 6–29)
AST: 15 U/L (ref 10–35)
Albumin: 4.3 g/dL (ref 3.6–5.1)
Alkaline phosphatase (APISO): 100 U/L (ref 37–153)
BUN: 15 mg/dL (ref 7–25)
CO2: 30 mmol/L (ref 20–32)
Calcium: 9.4 mg/dL (ref 8.6–10.4)
Chloride: 106 mmol/L (ref 98–110)
Creat: 0.68 mg/dL (ref 0.50–0.99)
Globulin: 2.8 g/dL (calc) (ref 1.9–3.7)
Glucose, Bld: 114 mg/dL — ABNORMAL HIGH (ref 65–99)
Potassium: 3.6 mmol/L (ref 3.5–5.3)
Sodium: 144 mmol/L (ref 135–146)
Total Bilirubin: 0.3 mg/dL (ref 0.2–1.2)
Total Protein: 7.1 g/dL (ref 6.1–8.1)

## 2019-05-10 LAB — LIPID PANEL
Cholesterol: 212 mg/dL — ABNORMAL HIGH (ref <200)
HDL: 77 mg/dL (ref >=50)
LDL Cholesterol (Calc): 118 mg/dL (calc) — ABNORMAL HIGH
Non-HDL Cholesterol (Calc): 135 mg/dL (calc) — ABNORMAL HIGH (ref <130)
Total CHOL/HDL Ratio: 2.8 (calc) (ref <5.0)
Triglycerides: 76 mg/dL (ref <150)

## 2019-05-10 LAB — HEMOGLOBIN A1C
Hgb A1c MFr Bld: 6.1 % of total Hgb — ABNORMAL HIGH (ref <5.7)
Mean Plasma Glucose: 128 (calc)
eAG (mmol/L): 7.1 (calc)

## 2019-05-10 NOTE — Assessment & Plan Note (Signed)
Discussed dietary changes, cut back on sweets, carbs, fast food

## 2019-05-10 NOTE — Assessment & Plan Note (Signed)
Well controlled 

## 2019-06-13 ENCOUNTER — Other Ambulatory Visit: Payer: Self-pay | Admitting: Family Medicine

## 2019-08-22 ENCOUNTER — Other Ambulatory Visit: Payer: Self-pay

## 2019-08-22 ENCOUNTER — Ambulatory Visit (INDEPENDENT_AMBULATORY_CARE_PROVIDER_SITE_OTHER): Payer: 59 | Admitting: Family Medicine

## 2019-08-22 ENCOUNTER — Encounter: Payer: Self-pay | Admitting: Family Medicine

## 2019-08-22 VITALS — BP 130/90 | HR 86 | Temp 97.7°F | Ht 63.0 in | Wt 246.2 lb

## 2019-08-22 DIAGNOSIS — G5601 Carpal tunnel syndrome, right upper limb: Secondary | ICD-10-CM | POA: Diagnosis not present

## 2019-08-22 DIAGNOSIS — R202 Paresthesia of skin: Secondary | ICD-10-CM | POA: Diagnosis not present

## 2019-08-22 DIAGNOSIS — M25511 Pain in right shoulder: Secondary | ICD-10-CM | POA: Diagnosis not present

## 2019-08-22 MED ORDER — METHYLPREDNISOLONE 4 MG PO TBPK: ORAL_TABLET | ORAL | 0 refills | Status: DC

## 2019-08-22 NOTE — Patient Instructions (Signed)
F/U as previous 

## 2019-08-22 NOTE — Progress Notes (Signed)
Subjective:    Patient ID: Julia Avery, female    DOB: 13-Sep-1955, 64 y.o.   MRN: [id number]  Patient presents for Ear Pain and Muscle Pain (ear, neck, arm)    2 weeks ago, had swelling in ankles/ lower legs, then last week  Right ear, down neck and arm.   No fever cough , congestion   Right ear hearing has improved already and the pain but she still has some pain in her neck/ shoulder and into her right arm..  Right hand has some tingling and numbness , she still has normal strength in her hand.  She states she is actually had this for quite some time.  She had an injection in her hand a couple years ago for carpal tunnel but it is getting worse. When she is typing she has symptoms/ has some weakness Will typically feel the discomfort in her right arm/shoulder when she moves it a certain way.  She had chest pain 2 1/2 weeks ago. Felt like she had pulled a muscle on the left side , this is already resolved.  She did stop the meloxicam, and swelling in feet went down     Review Of Systems:  GEN- denies fatigue, fever, weight loss,weakness, recent illness HEENT- denies eye drainage, change in vision, nasal discharge, CVS- denies chest pain, palpitations RESP- denies SOB, cough, wheeze ABD- denies N/V, change in stools, abd pain GU- denies dysuria, hematuria, dribbling, incontinence MSK-+ joint pain, muscle aches, injury Neuro- denies headache, dizziness, syncope, seizure activity       Objective:    BP 130/90   Pulse 86   Temp 97.7 F (36.5 C) (Temporal)   Ht 5\' 3"  (1.6 m)   Wt 246 lb 4 oz (111.7 kg)   SpO2 97%   BMI 43.62 kg/m  GEN- NAD, alert and oriented x3 HEENT- PERRL, EOMI, non injected sclera, pink conjunctiva, MMM, oropharynx clear, Right earwith wax right canal, left canal clear Neck- Supple, no thyromegalyFROM neg spurlings, mild TTP over SCM right side CVS- RRR, no murmur RESP-CTAB ABD-NABS,soft,NT,ND NEURO-CNII-XII grossly in tact, no focal  deficits, normal monofilament bilt hands  MSK- Fair ROM RUE compared to left, rotator cuff grossly in tact, mild TTP at [facility] and over deltoid, biceps in tact  EXT- No edema Pulses- Radial, DP- 2+        Assessment & Plan:      Problem List Items Addressed This Visit    None    Visit Diagnoses    Acute pain of right shoulder    -  Primary   Based on symptoms this is not a CVA. MSK pain, possible bursitis or arthritis, other possiblity is nerve impingnent from neck but difficult to appreciate because of carpal tunnel syndrome symptoms in the right hand.  We will try her on Medrol Dosepak for since more the pain is acute.  She does not tolerate muscle relaxer so we will hold off on this.  She is holding the meloxicam because of the swelling that she experience with it.  She will continue her oxycodone for her pain clinic.  If her pain does not improve with the Medrol Dosepak and I think we need to go ahead and proceed with x-ray of the cervical spine as well as the right shoulder.  I would also set her up for nerve conduction study with regards to probable carpal tunnel syndrome   Paresthesias in right hand       Carpal tunnel syndrome on  right          Note: This dictation was prepared with Dragon dictation along with smaller phrase technology. Any transcriptional errors that result from this process are unintentional.

## 2019-08-26 ENCOUNTER — Encounter: Payer: Self-pay | Admitting: Family Medicine

## 2019-09-05 ENCOUNTER — Telehealth: Payer: Self-pay | Admitting: *Deleted

## 2019-09-05 NOTE — Telephone Encounter (Signed)
Received VM from patient.   Reports that she is feeling much improved.   Reports that PCP requested update.   MD to be made aware.

## 2019-09-05 NOTE — Telephone Encounter (Signed)
Noted  

## 2019-10-07 ENCOUNTER — Encounter: Payer: Self-pay | Admitting: Family Medicine

## 2019-10-07 ENCOUNTER — Other Ambulatory Visit: Payer: Self-pay

## 2019-10-07 ENCOUNTER — Ambulatory Visit: Payer: 59 | Admitting: Family Medicine

## 2019-10-07 VITALS — BP 132/78 | HR 90 | Temp 98.1°F | Resp 14 | Ht 63.0 in | Wt 253.0 lb

## 2019-10-07 DIAGNOSIS — K5792 Diverticulitis of intestine, part unspecified, without perforation or abscess without bleeding: Secondary | ICD-10-CM | POA: Diagnosis not present

## 2019-10-07 DIAGNOSIS — R1032 Left lower quadrant pain: Secondary | ICD-10-CM

## 2019-10-07 LAB — CBC WITH DIFFERENTIAL/PLATELET
Absolute Monocytes: 570 cells/uL (ref 200–950)
Basophils Absolute: 90 cells/uL (ref 0–200)
Basophils Relative: 1.2 %
Eosinophils Absolute: 210 cells/uL (ref 15–500)
Eosinophils Relative: 2.8 %
HCT: 39.3 % (ref 35.0–45.0)
Hemoglobin: 12.8 g/dL (ref 11.7–15.5)
Lymphs Abs: 3345 cells/uL (ref 850–3900)
MCH: 28.6 pg (ref 27.0–33.0)
MCHC: 32.6 g/dL (ref 32.0–36.0)
MCV: 87.9 fL (ref 80.0–100.0)
MPV: 10.5 fL (ref 7.5–12.5)
Monocytes Relative: 7.6 %
Neutro Abs: 3285 cells/uL (ref 1500–7800)
Neutrophils Relative %: 43.8 %
Platelets: 439 10*3/uL — ABNORMAL HIGH (ref 140–400)
RBC: 4.47 10*6/uL (ref 3.80–5.10)
RDW: 14.5 % (ref 11.0–15.0)
Total Lymphocyte: 44.6 %
WBC: 7.5 10*3/uL (ref 3.8–10.8)

## 2019-10-07 LAB — URINALYSIS, ROUTINE W REFLEX MICROSCOPIC
Bacteria, UA: NONE SEEN /HPF
Bilirubin Urine: NEGATIVE
Glucose, UA: NEGATIVE
Hyaline Cast: NONE SEEN /LPF
Ketones, ur: NEGATIVE
Leukocytes,Ua: NEGATIVE
Nitrite: NEGATIVE
Protein, ur: NEGATIVE
Specific Gravity, Urine: 1.02 (ref 1.001–1.03)
WBC, UA: NONE SEEN /HPF (ref 0–5)
pH: 6 (ref 5.0–8.0)

## 2019-10-07 LAB — COMPREHENSIVE METABOLIC PANEL
AG Ratio: 1.5 (calc) (ref 1.0–2.5)
ALT: 20 U/L (ref 6–29)
AST: 18 U/L (ref 10–35)
Albumin: 4.3 g/dL (ref 3.6–5.1)
Alkaline phosphatase (APISO): 95 U/L (ref 37–153)
BUN: 10 mg/dL (ref 7–25)
CO2: 29 mmol/L (ref 20–32)
Calcium: 9.6 mg/dL (ref 8.6–10.4)
Chloride: 105 mmol/L (ref 98–110)
Creat: 0.73 mg/dL (ref 0.50–0.99)
Globulin: 2.8 g/dL (calc) (ref 1.9–3.7)
Glucose, Bld: 117 mg/dL — ABNORMAL HIGH (ref 65–99)
Potassium: 3.9 mmol/L (ref 3.5–5.3)
Sodium: 144 mmol/L (ref 135–146)
Total Bilirubin: 0.4 mg/dL (ref 0.2–1.2)
Total Protein: 7.1 g/dL (ref 6.1–8.1)

## 2019-10-07 LAB — MICROSCOPIC MESSAGE

## 2019-10-07 MED ORDER — CIPROFLOXACIN HCL 500 MG PO TABS: 500.0000 mg | ORAL_TABLET | Freq: Two times a day (BID) | ORAL | 0 refills | Status: DC

## 2019-10-07 MED ORDER — METRONIDAZOLE 500 MG PO TABS: 500.0000 mg | ORAL_TABLET | Freq: Two times a day (BID) | ORAL | 0 refills | Status: DC

## 2019-10-07 NOTE — Patient Instructions (Addendum)
Call if not improved on Friday  Start antibiotics Try to avoid nuts and seeds in diet

## 2019-10-07 NOTE — Progress Notes (Signed)
   Subjective:    Patient ID: Julia Avery, female    DOB: [birth year], 64 y.o.   MRN: [id number]  Patient presents for LLQ Pain (x10 days- no changes in BM, no nausea- sometimes feels like muscle strain)    Pt here with  Left lower abd pain for the past 10 days or so , no change in bowel movements, has sharp pain that is severe at times, other times feels like a pulling sensation No  Urinary symptoms, no vaginal discharge   she doesn't recall any injury has not worked this week, due to the pain  No N/V, no fever  She does have diverticular disease noted on colonoscopy in 2018  she has been taking percocet 3-4 tabelts a day due to pain  worse at night , nothing really makes it east up       Review Of Systems:  GEN- denies fatigue, fever, weight loss,weakness, recent illness HEENT- denies eye drainage, change in vision, nasal discharge, CVS- denies chest pain, palpitations RESP- denies SOB, cough, wheeze ABD- denies N/V, change in stools, +abd pain GU- denies dysuria, hematuria, dribbling, incontinence MSK- denies joint pain, muscle aches, injury Neuro- denies headache, dizziness, syncope, seizure activity       Objective:    BP 132/78   Pulse 90   Temp 98.1 F (36.7 C) (Temporal)   Resp 14   Ht 5\' 3"  (1.6 m)   Wt 253 lb (114.8 kg)   SpO2 97%   BMI 44.82 kg/m  GEN- NAD, alert and oriented x3 HEENT- PERRL, EOMI, non injected sclera, pink conjunctiva  CVS- RRR, no murmur RESP-CTAB ABD-NABS,soft,TTP LLQ, no rebound, no guarding, no mass palpated, no CVA tenderness  EXT- No edema Pulses- Radial, 2+        Assessment & Plan:      Problem List Items Addressed This Visit    None    Visit Diagnoses    LLQ pain    -  Primary   LLQ pain, DD diverticulitis , UTI, kidney stone, pelvic pathology, UA unremarkable, with history check labs, start cipro/flagyl to cover for diverticulitis, if not improved by Friday, unless labs point a different direction will  obtain CT abd/pelvis    Relevant Orders   Urinalysis, Routine w reflex microscopic (Completed)   CBC with Differential/Platelet   Comprehensive metabolic panel   Diverticulitis       Relevant Orders   CBC with Differential/Platelet   Comprehensive metabolic panel      Note: This dictation was prepared with Dragon dictation along with smaller phrase technology. Any transcriptional errors that result from this process are unintentional.

## 2019-10-09 ENCOUNTER — Other Ambulatory Visit: Payer: Self-pay | Admitting: *Deleted

## 2019-10-09 MED ORDER — ONDANSETRON 4 MG PO TBDP: 4.0000 mg | ORAL_TABLET | Freq: Three times a day (TID) | ORAL | 0 refills | Status: DC | PRN

## 2019-10-27 ENCOUNTER — Telehealth: Payer: Self-pay

## 2019-10-27 NOTE — Telephone Encounter (Signed)
[place] office called to see if patient needed premed before dental procedure today. After looking at list of provider preferences, I told her that Dr.Yates does not require premed.

## 2019-11-07 ENCOUNTER — Ambulatory Visit: Payer: BC Managed Care – PPO | Admitting: Family Medicine

## 2019-11-12 ENCOUNTER — Ambulatory Visit: Payer: BC Managed Care – PPO | Attending: Internal Medicine

## 2019-11-12 DIAGNOSIS — Z23 Encounter for immunization: Secondary | ICD-10-CM

## 2019-11-12 NOTE — Progress Notes (Signed)
   Covid-19 Vaccination Clinic  Name:  Julia Avery    MRN: [id number] DOB: 1956/01/21  11/12/2019  [name] was observed post Covid-19 immunization for 15 minutes without incident. She was provided with Vaccine Information Sheet and instruction to access the V-Safe system.   [name] was instructed to call 911 with any severe reactions post vaccine: Marland Kitchen Difficulty breathing  . Swelling of face and throat  . A fast heartbeat  . A bad rash all over body  . Dizziness and weakness   Immunizations Administered    Name Date Dose VIS Date Route   Moderna COVID-19 Vaccine 11/12/2019 10:48 AM 0.5 mL 06/2019 Intramuscular   Manufacturer: Moderna   Lot: [id number]   [id number]

## 2019-11-14 ENCOUNTER — Encounter: Payer: Self-pay | Admitting: Family Medicine

## 2019-11-14 ENCOUNTER — Other Ambulatory Visit: Payer: Self-pay

## 2019-11-14 ENCOUNTER — Ambulatory Visit: Payer: 59 | Admitting: Family Medicine

## 2019-11-14 VITALS — BP 130/74 | HR 78 | Temp 98.7°F | Resp 16 | Ht 63.0 in | Wt 251.0 lb

## 2019-11-14 DIAGNOSIS — E669 Obesity, unspecified: Secondary | ICD-10-CM

## 2019-11-14 DIAGNOSIS — I10 Essential (primary) hypertension: Secondary | ICD-10-CM | POA: Diagnosis not present

## 2019-11-14 DIAGNOSIS — R7303 Prediabetes: Secondary | ICD-10-CM | POA: Diagnosis not present

## 2019-11-14 DIAGNOSIS — G8929 Other chronic pain: Secondary | ICD-10-CM

## 2019-11-14 DIAGNOSIS — M549 Dorsalgia, unspecified: Secondary | ICD-10-CM | POA: Diagnosis not present

## 2019-11-14 NOTE — Patient Instructions (Addendum)
Return in 1 month for fasting labs  F/U6 MONTHS for Physical

## 2019-11-14 NOTE — Progress Notes (Signed)
   Subjective:    Patient ID: Julia Avery, female    DOB: October 29, 1955, 64 y.o.   MRN: [id number]  Patient presents for Follow-up (is not fasting)    Pt here to f/u chronic medical problem.   HTN-  Taking norvasc as prescribed     Chronic pain management- taking percocet as prescribed    Taking MVI and vitamin D due to previous deficiency   COVID-19 vaccine #1 done    Pre-diabetes -last A1C 6.1% , she admits that she has not been eating the healthiest.  She would like to return in 1 month to have her fasting labs done  Hyperlipidemia- last LDL 118/ TC was  212     Previous abdominal pain has resolved Review Of Systems:  GEN- denies fatigue, fever, weight loss,weakness, recent illness HEENT- denies eye drainage, change in vision, nasal discharge, CVS- denies chest pain, palpitations RESP- denies SOB, cough, wheeze ABD- denies N/V, change in stools, abd pain GU- denies dysuria, hematuria, dribbling, incontinence MSK- + joint pain, muscle aches, injury Neuro- denies headache, dizziness, syncope, seizure activity       Objective:    BP 130/74   Pulse 78   Temp 98.7 F (37.1 C) (Temporal)   Resp 16   Ht 5\' 3"  (1.6 m)   Wt 251 lb (113.9 kg)   BMI 44.46 kg/m  GEN- NAD, alert and oriented x3 HEENT- PERRL, EOMI, non injected sclera, pink conjunctiva, MMM, oropharynx clear Neck- Supple, no thyromegaly CVS- RRR, no murmur RESP-CTAB ABD-NABS,soft,NT,ND EXT- No edema Pulses- Radial, DP- 2+        Assessment & Plan:      Problem List Items Addressed This Visit      Unprioritized   Borderline diabetes   Relevant Orders   Hemoglobin A1c   Chronic back pain    Medications per pain management.      Class 3 obesity     Diabetes mellitus along with hypertension.  When she returns in 1 month we will check her lipid panel as this was mildly elevated at her last check last year along with A1c.  Discussed dietary changes to make in order to decrease  cardiovascular risk and decrease presentation of diabetes mellitus      Relevant Orders   Lipid panel   Essential hypertension, benign - Primary    Blood pressure is controlled no change in medication.      Relevant Orders   Comprehensive metabolic panel   Lipid panel      Note: This dictation was prepared with Dragon dictation along with smaller phrase technology. Any transcriptional errors that result from this process are unintentional.

## 2019-11-14 NOTE — Assessment & Plan Note (Signed)
  Diabetes mellitus along with hypertension.  When she returns in 1 month we will check her lipid panel as this was mildly elevated at her last check last year along with A1c.  Discussed dietary changes to make in order to decrease cardiovascular risk and decrease presentation of diabetes mellitus

## 2019-11-14 NOTE — Assessment & Plan Note (Signed)
Blood pressure is controlled no change in medication. 

## 2019-11-14 NOTE — Assessment & Plan Note (Signed)
Medications per pain management.

## 2019-12-10 ENCOUNTER — Other Ambulatory Visit: Payer: Self-pay | Admitting: Family Medicine

## 2019-12-10 ENCOUNTER — Ambulatory Visit: Payer: Self-pay | Attending: Internal Medicine

## 2019-12-10 DIAGNOSIS — Z23 Encounter for immunization: Secondary | ICD-10-CM

## 2019-12-10 NOTE — Progress Notes (Signed)
   Covid-19 Vaccination Clinic  Name:  Julia Avery    MRN: [id number] DOB: 1956-01-06  12/10/2019  [name] was observed post Covid-19 immunization for 15 minutes without incident. She was provided with Vaccine Information Sheet and instruction to access the V-Safe system.   [name] was instructed to call 911 with any severe reactions post vaccine: Marland Kitchen Difficulty breathing  . Swelling of face and throat  . A fast heartbeat  . A bad rash all over body  . Dizziness and weakness   Immunizations Administered    Name Date Dose VIS Date Route   Moderna COVID-19 Vaccine 12/10/2019 10:10 AM 0.5 mL 06/2019 Intramuscular   Manufacturer: Moderna   Lot: [id number]   [id number]

## 2020-05-11 ENCOUNTER — Other Ambulatory Visit: Payer: Self-pay

## 2020-05-11 ENCOUNTER — Ambulatory Visit (INDEPENDENT_AMBULATORY_CARE_PROVIDER_SITE_OTHER): Payer: 59 | Admitting: Family Medicine

## 2020-05-11 ENCOUNTER — Encounter: Payer: Self-pay | Admitting: Family Medicine

## 2020-05-11 VITALS — BP 144/84 | HR 82 | Temp 98.3°F | Ht 62.0 in | Wt 247.2 lb

## 2020-05-11 DIAGNOSIS — E669 Obesity, unspecified: Secondary | ICD-10-CM | POA: Diagnosis not present

## 2020-05-11 DIAGNOSIS — R7303 Prediabetes: Secondary | ICD-10-CM | POA: Diagnosis not present

## 2020-05-11 DIAGNOSIS — M51369 Other intervertebral disc degeneration, lumbar region without mention of lumbar back pain or lower extremity pain: Secondary | ICD-10-CM

## 2020-05-11 DIAGNOSIS — M5136 Other intervertebral disc degeneration, lumbar region: Secondary | ICD-10-CM

## 2020-05-11 DIAGNOSIS — Z0001 Encounter for general adult medical examination with abnormal findings: Secondary | ICD-10-CM

## 2020-05-11 DIAGNOSIS — I10 Essential (primary) hypertension: Secondary | ICD-10-CM

## 2020-05-11 DIAGNOSIS — Z0000 Encounter for general adult medical examination without abnormal findings: Secondary | ICD-10-CM

## 2020-05-11 DIAGNOSIS — Z1231 Encounter for screening mammogram for malignant neoplasm of breast: Secondary | ICD-10-CM

## 2020-05-11 DIAGNOSIS — E66813 Obesity, class 3: Secondary | ICD-10-CM

## 2020-05-11 NOTE — Assessment & Plan Note (Signed)
Has been controlled, fasting today Continue to monitor blood pressure

## 2020-05-11 NOTE — Progress Notes (Signed)
   Subjective:    Patient ID: Julia Avery, female    DOB: 1956-06-02, 64 y.o.   MRN: [id number]  Patient presents for Annual Exam  Pt here for CPE, medications and history reviewed  Colonoscopy UTD  Mammogram - Due  Normal PAP 2018, DECLINES PAP today  Obesity- weight down 5lbs since April, often skips breakfast because she is not hungry and just doesn't eat as much   Borderline DM - last A1C   Vitamin D, MVI, Vitamin C, Zinc   Review Of Systems:  GEN- denies fatigue, fever, weight loss,weakness, recent illness HEENT- denies eye drainage, change in vision, nasal discharge, CVS- denies chest pain, palpitations RESP- denies SOB, cough, wheeze ABD- denies N/V, change in stools, abd pain GU- denies dysuria, hematuria, dribbling, incontinence MSK- denies joint pain, muscle aches, injury Neuro- denies headache, dizziness, syncope, seizure activity       Objective:    BP (!) 144/84 (BP Location: Right Arm, Patient Position: Sitting, Cuff Size: Large)   Pulse 82   Temp 98.3 F (36.8 C) (Oral)   Ht 5\' 2"  (1.575 m)   Wt 247 lb 3.2 oz (112.1 kg)   SpO2 99%   BMI 45.21 kg/m  GEN- NAD, alert and oriented x3 HEENT- PERRL, EOMI, non injected sclera, pink conjunctiva, MMM, oropharynx clear Neck- Supple, no thyromegaly CVS- RRR, no murmur RESP-CTAB ABD-NABS,soft,NT,ND EXT- No edema Pulses- Radial, DP- 2+   FALL/AUDITC/ Depression screen neg      Assessment & Plan:    Pt is vaping, instead of smoking cigarrettes   Problem List Items Addressed This Visit      Unprioritized   Borderline diabetes    Reduce sugars in diet, processed food Check A1C       Relevant Orders   Hemoglobin A1c   Class 3 obesity   Relevant Orders   Lipid panel   DDD (degenerative disc disease), lumbar    Pain management from pain clinic       Essential hypertension, benign    Has been controlled, fasting today Continue to monitor blood pressure       Relevant Orders    CBC with Differential/Platelet   Comprehensive metabolic panel   Lipid panel    Other Visit Diagnoses    Routine general medical examination at a health care facility    -  Primary   CPE done, to schedule mammogram, declines Vaccines today, declines PAP    Encounter for screening mammogram for malignant neoplasm of breast       Relevant Orders   MM 3D SCREEN BREAST BILATERAL      Note: This dictation was prepared with Dragon dictation along with smaller phrase technology. Any transcriptional errors that result from this process are unintentional.

## 2020-05-11 NOTE — Patient Instructions (Signed)
F/U 6 months Schedule your mammogram We will call with lab results

## 2020-05-11 NOTE — Assessment & Plan Note (Signed)
Pain management from pain clinic

## 2020-05-11 NOTE — Assessment & Plan Note (Signed)
Reduce sugars in diet, processed food Check A1C

## 2020-05-12 ENCOUNTER — Encounter: Payer: Self-pay | Admitting: Family Medicine

## 2020-05-12 DIAGNOSIS — E785 Hyperlipidemia, unspecified: Secondary | ICD-10-CM | POA: Insufficient documentation

## 2020-05-12 LAB — CBC WITH DIFFERENTIAL/PLATELET
Absolute Monocytes: 538 cells/uL (ref 200–950)
Basophils Absolute: 92 cells/uL (ref 0–200)
Basophils Relative: 1.1 %
Eosinophils Absolute: 260 cells/uL (ref 15–500)
Eosinophils Relative: 3.1 %
HCT: 38.1 % (ref 35.0–45.0)
Hemoglobin: 12.6 g/dL (ref 11.7–15.5)
Lymphs Abs: 3923 cells/uL — ABNORMAL HIGH (ref 850–3900)
MCH: 28.8 pg (ref 27.0–33.0)
MCHC: 33.1 g/dL (ref 32.0–36.0)
MCV: 87.2 fL (ref 80.0–100.0)
MPV: 10.3 fL (ref 7.5–12.5)
Monocytes Relative: 6.4 %
Neutro Abs: 3587 cells/uL (ref 1500–7800)
Neutrophils Relative %: 42.7 %
Platelets: 453 10*3/uL — ABNORMAL HIGH (ref 140–400)
RBC: 4.37 10*6/uL (ref 3.80–5.10)
RDW: 14.3 % (ref 11.0–15.0)
Total Lymphocyte: 46.7 %
WBC: 8.4 10*3/uL (ref 3.8–10.8)

## 2020-05-12 LAB — COMPREHENSIVE METABOLIC PANEL
AG Ratio: 1.4 (calc) (ref 1.0–2.5)
ALT: 20 U/L (ref 6–29)
AST: 18 U/L (ref 10–35)
Albumin: 4.2 g/dL (ref 3.6–5.1)
Alkaline phosphatase (APISO): 95 U/L (ref 37–153)
BUN: 8 mg/dL (ref 7–25)
CO2: 31 mmol/L (ref 20–32)
Calcium: 9.7 mg/dL (ref 8.6–10.4)
Chloride: 104 mmol/L (ref 98–110)
Creat: 0.7 mg/dL (ref 0.50–0.99)
Globulin: 3 g/dL (calc) (ref 1.9–3.7)
Glucose, Bld: 99 mg/dL (ref 65–99)
Potassium: 3.5 mmol/L (ref 3.5–5.3)
Sodium: 144 mmol/L (ref 135–146)
Total Bilirubin: 0.3 mg/dL (ref 0.2–1.2)
Total Protein: 7.2 g/dL (ref 6.1–8.1)

## 2020-05-12 LAB — LIPID PANEL
Cholesterol: 217 mg/dL — ABNORMAL HIGH (ref <200)
HDL: 72 mg/dL (ref >=50)
LDL Cholesterol (Calc): 130 mg/dL (calc) — ABNORMAL HIGH
Non-HDL Cholesterol (Calc): 145 mg/dL (calc) — ABNORMAL HIGH (ref <130)
Total CHOL/HDL Ratio: 3 (calc) (ref <5.0)
Triglycerides: 61 mg/dL (ref <150)

## 2020-05-12 LAB — HEMOGLOBIN A1C
Hgb A1c MFr Bld: 6.2 % of total Hgb — ABNORMAL HIGH (ref <5.7)
Mean Plasma Glucose: 131 (calc)
eAG (mmol/L): 7.3 (calc)

## 2020-08-25 ENCOUNTER — Other Ambulatory Visit: Payer: Self-pay | Admitting: Family Medicine

## 2020-11-09 ENCOUNTER — Ambulatory Visit: Payer: 59 | Admitting: Family Medicine

## 2021-05-18 ENCOUNTER — Other Ambulatory Visit: Payer: Self-pay | Admitting: Internal Medicine

## 2021-05-18 DIAGNOSIS — Z139 Encounter for screening, unspecified: Secondary | ICD-10-CM

## 2021-05-24 ENCOUNTER — Ambulatory Visit
Admission: RE | Admit: 2021-05-24 | Discharge: 2021-05-24 | Disposition: A | Discharge status: Home / Self Care | Payer: 59 | Source: Ambulatory Visit | Attending: Internal Medicine | Admitting: Internal Medicine

## 2021-05-24 ENCOUNTER — Other Ambulatory Visit: Payer: Self-pay

## 2021-05-24 DIAGNOSIS — Z139 Encounter for screening, unspecified: Secondary | ICD-10-CM

## 2022-05-18 ENCOUNTER — Encounter: Payer: Self-pay | Admitting: *Deleted

## 2022-08-12 IMAGING — MG MM DIGITAL SCREENING BILAT W/ TOMO AND CAD
8 series · 8 of 24 positions shown · non-contrast
Comparison: None.

ACR Breast Density Category a: The breast tissue is almost entirely
fatty.

CLINICAL DATA: Screening.

EXAM:
DIGITAL SCREENING BILATERAL MAMMOGRAM WITH TOMOSYNTHESIS AND CAD
TECHNIQUE: Bilateral screening digital craniocaudal and mediolateral oblique
mammograms were obtained. Bilateral screening digital breast
tomosynthesis was performed. The images were evaluated with
computer-aided detection.

[R MLO synth-2D]
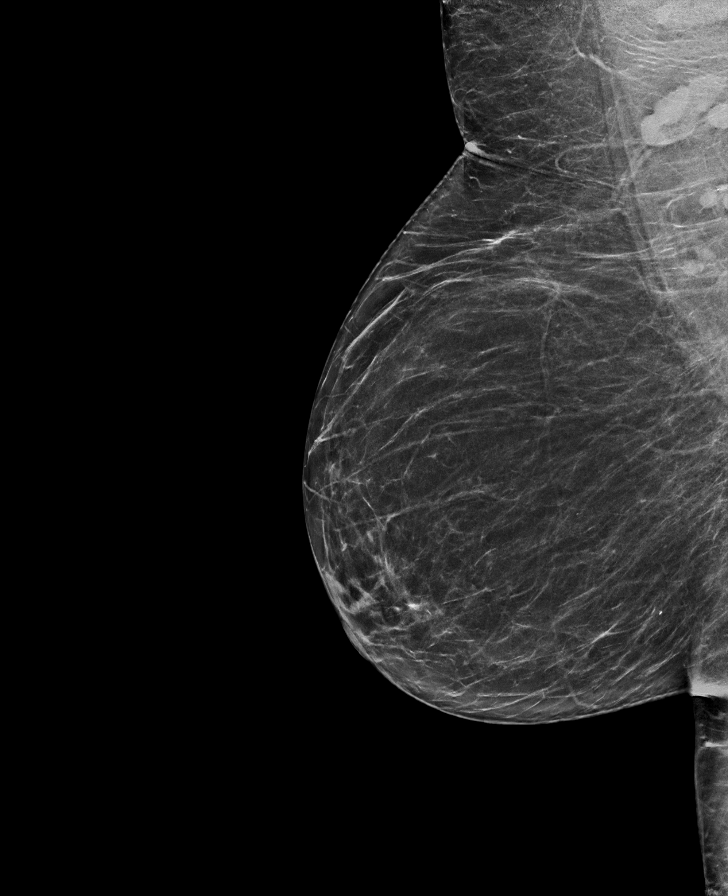

[L CC synth-2D]
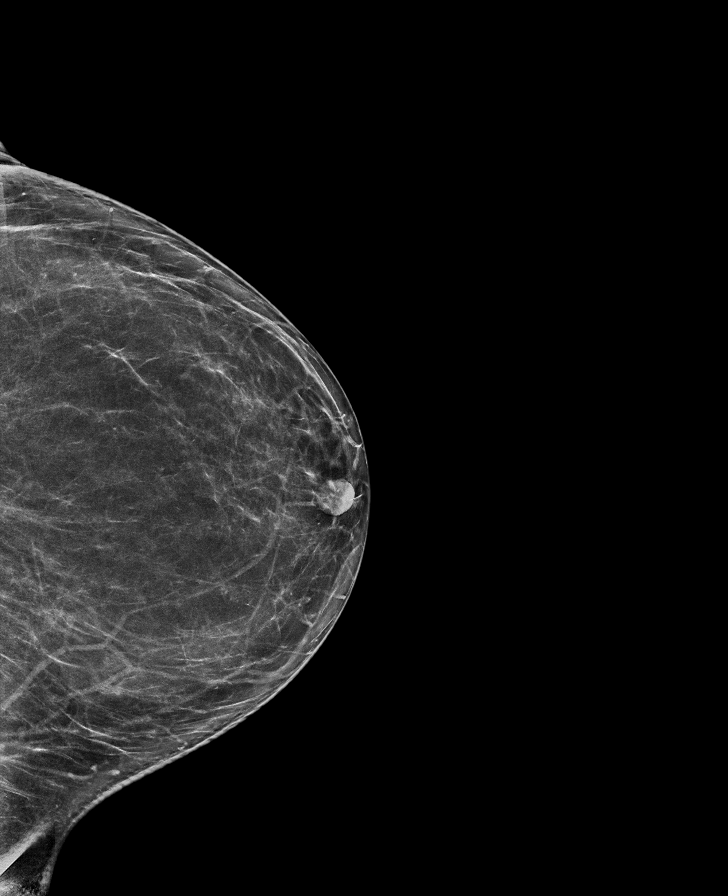

[L MLO synth-2D]
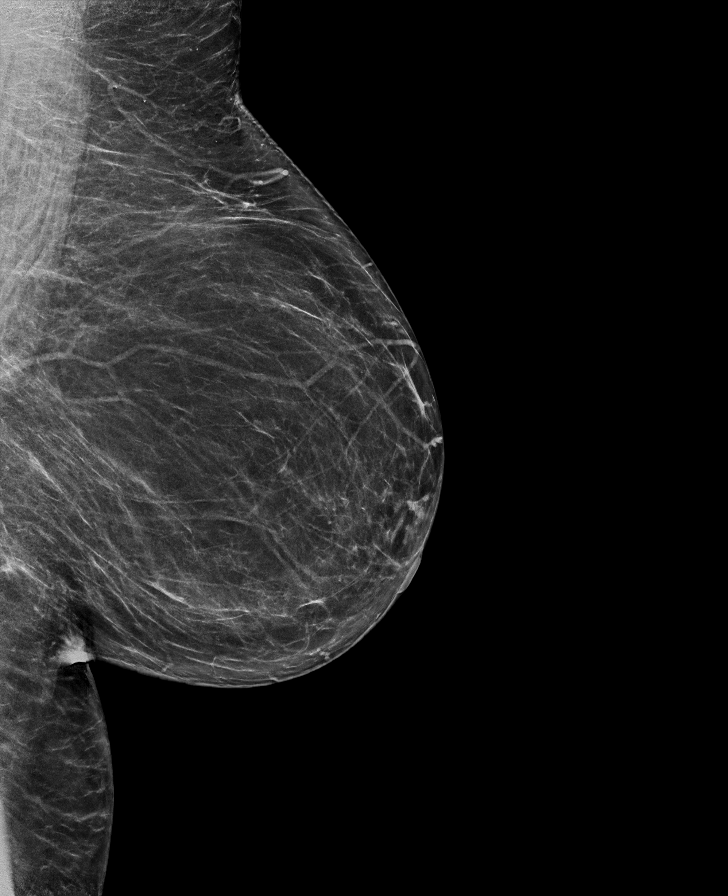

[R CC synth-2D]
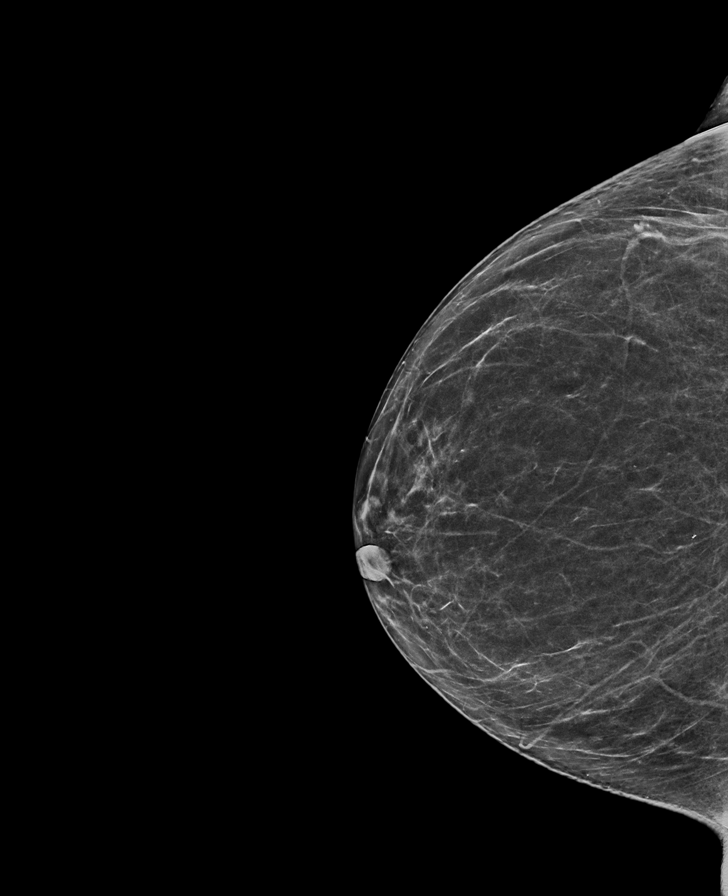

[R MLO tomo · tomo slice 37/74.0]
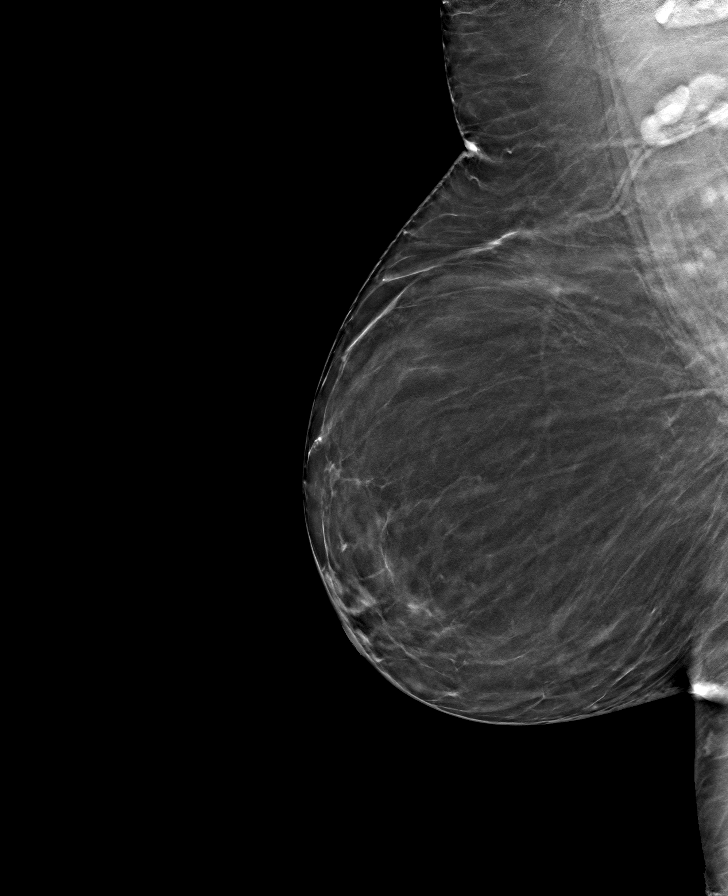

[L CC tomo · tomo slice 38/75.0]
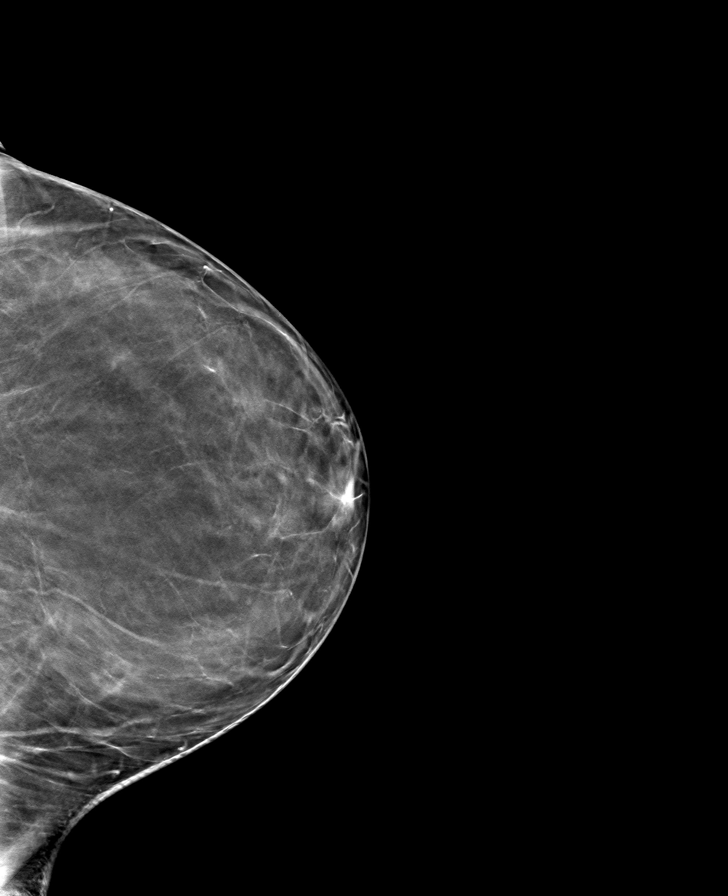

[L MLO tomo · tomo slice 38/75.0]
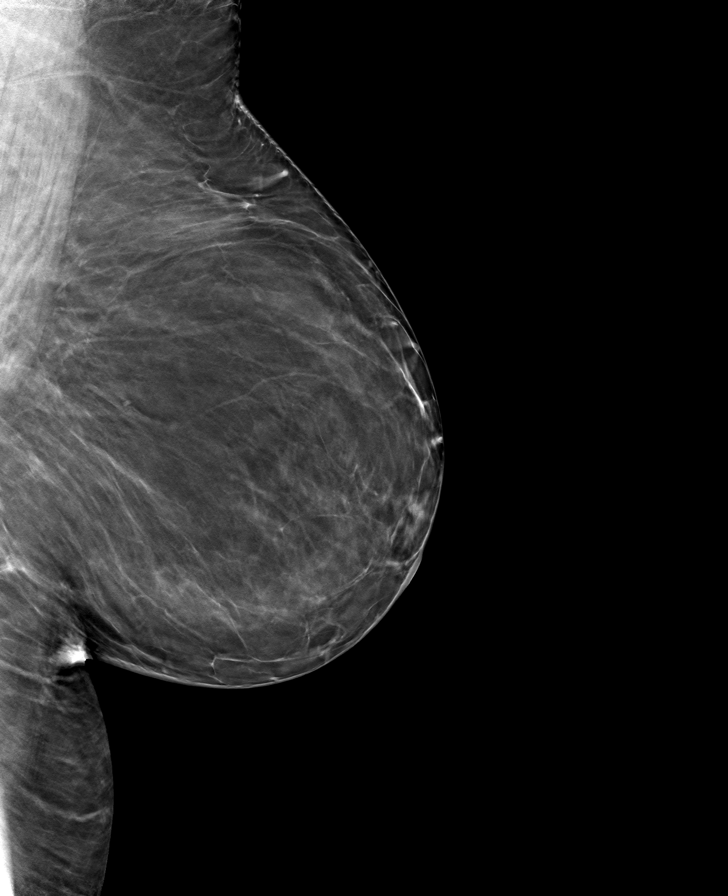

[R CC tomo · tomo slice 35/69.0]
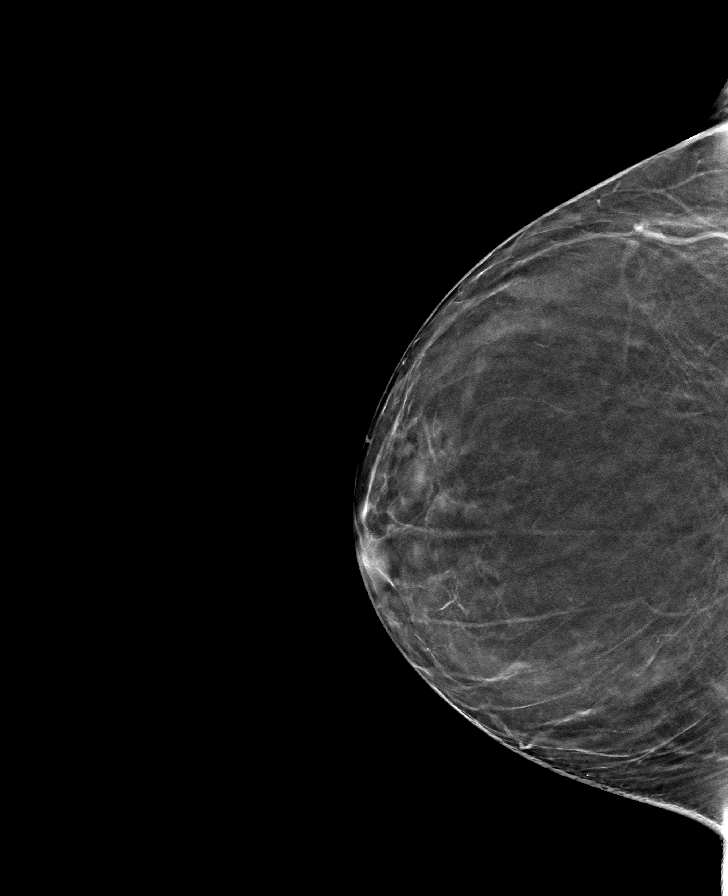

[8 of 24 positions shown; findings below may reference images not displayed]

FINDINGS: There are no findings suspicious for malignancy.
IMPRESSION: No mammographic evidence of malignancy. A result letter of this
screening mammogram will be mailed directly to the patient.

RECOMMENDATION:
Screening mammogram in one year. (Code:44-M-M6Q)

BI-RADS CATEGORY  1: Negative.

## 2023-02-27 ENCOUNTER — Other Ambulatory Visit: Payer: Self-pay | Admitting: Internal Medicine

## 2023-02-27 DIAGNOSIS — Z1231 Encounter for screening mammogram for malignant neoplasm of breast: Secondary | ICD-10-CM

## 2023-03-26 ENCOUNTER — Other Ambulatory Visit (HOSPITAL_COMMUNITY): Payer: Self-pay | Admitting: Physical Medicine and Rehabilitation

## 2023-03-26 DIAGNOSIS — M5412 Radiculopathy, cervical region: Secondary | ICD-10-CM

## 2023-03-27 ENCOUNTER — Inpatient Hospital Stay: Admission: RE | Admit: 2023-03-27 | Payer: Self-pay | Source: Ambulatory Visit

## 2023-03-31 ENCOUNTER — Ambulatory Visit (HOSPITAL_COMMUNITY): Payer: Self-pay

## 2023-03-31 ENCOUNTER — Encounter (HOSPITAL_COMMUNITY): Payer: Self-pay

## 2023-04-03 ENCOUNTER — Inpatient Hospital Stay: Admission: RE | Admit: 2023-04-03 | Payer: Self-pay | Source: Ambulatory Visit

## 2023-08-14 DIAGNOSIS — G4700 Insomnia, unspecified: Secondary | ICD-10-CM | POA: Diagnosis not present

## 2023-08-14 DIAGNOSIS — G894 Chronic pain syndrome: Secondary | ICD-10-CM | POA: Diagnosis not present

## 2023-08-14 DIAGNOSIS — M4807 Spinal stenosis, lumbosacral region: Secondary | ICD-10-CM | POA: Diagnosis not present

## 2023-08-14 DIAGNOSIS — M47897 Other spondylosis, lumbosacral region: Secondary | ICD-10-CM | POA: Diagnosis not present

## 2023-09-12 DIAGNOSIS — G894 Chronic pain syndrome: Secondary | ICD-10-CM | POA: Diagnosis not present

## 2023-09-12 DIAGNOSIS — M4807 Spinal stenosis, lumbosacral region: Secondary | ICD-10-CM | POA: Diagnosis not present

## 2023-09-12 DIAGNOSIS — G4700 Insomnia, unspecified: Secondary | ICD-10-CM | POA: Diagnosis not present

## 2023-09-12 DIAGNOSIS — M47897 Other spondylosis, lumbosacral region: Secondary | ICD-10-CM | POA: Diagnosis not present

## 2023-10-11 DIAGNOSIS — G894 Chronic pain syndrome: Secondary | ICD-10-CM | POA: Diagnosis not present

## 2023-10-11 DIAGNOSIS — M4807 Spinal stenosis, lumbosacral region: Secondary | ICD-10-CM | POA: Diagnosis not present

## 2023-10-11 DIAGNOSIS — M47897 Other spondylosis, lumbosacral region: Secondary | ICD-10-CM | POA: Diagnosis not present

## 2023-10-11 DIAGNOSIS — G4700 Insomnia, unspecified: Secondary | ICD-10-CM | POA: Diagnosis not present

## 2023-11-12 DIAGNOSIS — G894 Chronic pain syndrome: Secondary | ICD-10-CM | POA: Diagnosis not present

## 2023-11-12 DIAGNOSIS — G4700 Insomnia, unspecified: Secondary | ICD-10-CM | POA: Diagnosis not present

## 2023-11-12 DIAGNOSIS — M47897 Other spondylosis, lumbosacral region: Secondary | ICD-10-CM | POA: Diagnosis not present

## 2023-11-12 DIAGNOSIS — M4807 Spinal stenosis, lumbosacral region: Secondary | ICD-10-CM | POA: Diagnosis not present

## 2023-11-27 ENCOUNTER — Other Ambulatory Visit (HOSPITAL_COMMUNITY): Payer: Self-pay | Admitting: Physical Medicine and Rehabilitation

## 2023-11-27 DIAGNOSIS — M79604 Pain in right leg: Secondary | ICD-10-CM

## 2023-11-27 DIAGNOSIS — M545 Low back pain, unspecified: Secondary | ICD-10-CM

## 2023-12-06 ENCOUNTER — Ambulatory Visit (HOSPITAL_COMMUNITY)
Admission: RE | Admit: 2023-12-06 | Discharge: 2023-12-06 | Disposition: A | Discharge status: Home / Self Care | Payer: Self-pay | Source: Ambulatory Visit | Attending: Physical Medicine and Rehabilitation | Admitting: Physical Medicine and Rehabilitation

## 2023-12-06 DIAGNOSIS — M4726 Other spondylosis with radiculopathy, lumbar region: Secondary | ICD-10-CM | POA: Diagnosis not present

## 2023-12-06 DIAGNOSIS — M545 Low back pain, unspecified: Secondary | ICD-10-CM | POA: Insufficient documentation

## 2023-12-06 DIAGNOSIS — M47817 Spondylosis without myelopathy or radiculopathy, lumbosacral region: Secondary | ICD-10-CM | POA: Diagnosis not present

## 2023-12-06 DIAGNOSIS — M48061 Spinal stenosis, lumbar region without neurogenic claudication: Secondary | ICD-10-CM | POA: Diagnosis not present

## 2023-12-06 DIAGNOSIS — M4186 Other forms of scoliosis, lumbar region: Secondary | ICD-10-CM | POA: Diagnosis not present

## 2023-12-06 DIAGNOSIS — M79604 Pain in right leg: Secondary | ICD-10-CM | POA: Diagnosis not present

## 2023-12-11 DIAGNOSIS — M47897 Other spondylosis, lumbosacral region: Secondary | ICD-10-CM | POA: Diagnosis not present

## 2023-12-11 DIAGNOSIS — M4807 Spinal stenosis, lumbosacral region: Secondary | ICD-10-CM | POA: Diagnosis not present

## 2023-12-11 DIAGNOSIS — G894 Chronic pain syndrome: Secondary | ICD-10-CM | POA: Diagnosis not present

## 2023-12-11 DIAGNOSIS — G4700 Insomnia, unspecified: Secondary | ICD-10-CM | POA: Diagnosis not present

## 2024-01-09 DIAGNOSIS — G4700 Insomnia, unspecified: Secondary | ICD-10-CM | POA: Diagnosis not present

## 2024-01-09 DIAGNOSIS — M4807 Spinal stenosis, lumbosacral region: Secondary | ICD-10-CM | POA: Diagnosis not present

## 2024-01-09 DIAGNOSIS — G894 Chronic pain syndrome: Secondary | ICD-10-CM | POA: Diagnosis not present

## 2024-01-09 DIAGNOSIS — Z79891 Long term (current) use of opiate analgesic: Secondary | ICD-10-CM | POA: Diagnosis not present

## 2024-01-09 DIAGNOSIS — M47897 Other spondylosis, lumbosacral region: Secondary | ICD-10-CM | POA: Diagnosis not present

## 2024-01-14 DIAGNOSIS — E2839 Other primary ovarian failure: Secondary | ICD-10-CM | POA: Diagnosis not present

## 2024-01-14 DIAGNOSIS — Z299 Encounter for prophylactic measures, unspecified: Secondary | ICD-10-CM | POA: Diagnosis not present

## 2024-01-14 DIAGNOSIS — Z0000 Encounter for general adult medical examination without abnormal findings: Secondary | ICD-10-CM | POA: Diagnosis not present

## 2024-01-14 DIAGNOSIS — Z79899 Other long term (current) drug therapy: Secondary | ICD-10-CM | POA: Diagnosis not present

## 2024-01-14 DIAGNOSIS — M2550 Pain in unspecified joint: Secondary | ICD-10-CM | POA: Diagnosis not present

## 2024-01-14 DIAGNOSIS — Z1339 Encounter for screening examination for other mental health and behavioral disorders: Secondary | ICD-10-CM | POA: Diagnosis not present

## 2024-01-14 DIAGNOSIS — I10 Essential (primary) hypertension: Secondary | ICD-10-CM | POA: Diagnosis not present

## 2024-01-14 DIAGNOSIS — Z7189 Other specified counseling: Secondary | ICD-10-CM | POA: Diagnosis not present

## 2024-01-14 DIAGNOSIS — Z1331 Encounter for screening for depression: Secondary | ICD-10-CM | POA: Diagnosis not present

## 2024-01-16 ENCOUNTER — Encounter (INDEPENDENT_AMBULATORY_CARE_PROVIDER_SITE_OTHER): Payer: Self-pay | Admitting: *Deleted

## 2024-02-07 DIAGNOSIS — M4807 Spinal stenosis, lumbosacral region: Secondary | ICD-10-CM | POA: Diagnosis not present

## 2024-02-07 DIAGNOSIS — G4700 Insomnia, unspecified: Secondary | ICD-10-CM | POA: Diagnosis not present

## 2024-02-07 DIAGNOSIS — M47897 Other spondylosis, lumbosacral region: Secondary | ICD-10-CM | POA: Diagnosis not present

## 2024-02-07 DIAGNOSIS — G894 Chronic pain syndrome: Secondary | ICD-10-CM | POA: Diagnosis not present

## 2024-03-03 DIAGNOSIS — Z6839 Body mass index (BMI) 39.0-39.9, adult: Secondary | ICD-10-CM | POA: Diagnosis not present

## 2024-03-03 DIAGNOSIS — I10 Essential (primary) hypertension: Secondary | ICD-10-CM | POA: Diagnosis not present

## 2024-03-03 DIAGNOSIS — Z299 Encounter for prophylactic measures, unspecified: Secondary | ICD-10-CM | POA: Diagnosis not present

## 2024-03-03 DIAGNOSIS — B3731 Acute candidiasis of vulva and vagina: Secondary | ICD-10-CM | POA: Diagnosis not present

## 2024-03-03 DIAGNOSIS — G5602 Carpal tunnel syndrome, left upper limb: Secondary | ICD-10-CM | POA: Diagnosis not present

## 2024-03-06 DIAGNOSIS — G5602 Carpal tunnel syndrome, left upper limb: Secondary | ICD-10-CM | POA: Diagnosis not present

## 2024-03-10 DIAGNOSIS — M4807 Spinal stenosis, lumbosacral region: Secondary | ICD-10-CM | POA: Diagnosis not present

## 2024-03-10 DIAGNOSIS — M47897 Other spondylosis, lumbosacral region: Secondary | ICD-10-CM | POA: Diagnosis not present

## 2024-03-10 DIAGNOSIS — G5602 Carpal tunnel syndrome, left upper limb: Secondary | ICD-10-CM | POA: Diagnosis not present

## 2024-03-10 DIAGNOSIS — G4700 Insomnia, unspecified: Secondary | ICD-10-CM | POA: Diagnosis not present

## 2024-03-10 DIAGNOSIS — G894 Chronic pain syndrome: Secondary | ICD-10-CM | POA: Diagnosis not present

## 2024-03-11 ENCOUNTER — Other Ambulatory Visit: Payer: Self-pay | Admitting: Internal Medicine

## 2024-03-11 DIAGNOSIS — Z1231 Encounter for screening mammogram for malignant neoplasm of breast: Secondary | ICD-10-CM

## 2024-03-18 ENCOUNTER — Ambulatory Visit
Admission: RE | Admit: 2024-03-18 | Discharge: 2024-03-18 | Disposition: A | Discharge status: Home / Self Care | Source: Ambulatory Visit | Attending: Family Medicine | Admitting: Family Medicine

## 2024-03-18 DIAGNOSIS — Z1231 Encounter for screening mammogram for malignant neoplasm of breast: Secondary | ICD-10-CM | POA: Diagnosis not present

## 2024-04-08 DIAGNOSIS — G4700 Insomnia, unspecified: Secondary | ICD-10-CM | POA: Diagnosis not present

## 2024-04-08 DIAGNOSIS — M47897 Other spondylosis, lumbosacral region: Secondary | ICD-10-CM | POA: Diagnosis not present

## 2024-04-08 DIAGNOSIS — G894 Chronic pain syndrome: Secondary | ICD-10-CM | POA: Diagnosis not present

## 2024-04-08 DIAGNOSIS — M4807 Spinal stenosis, lumbosacral region: Secondary | ICD-10-CM | POA: Diagnosis not present

## 2024-05-14 DIAGNOSIS — S92514A Nondisplaced fracture of proximal phalanx of right lesser toe(s), initial encounter for closed fracture: Secondary | ICD-10-CM | POA: Diagnosis not present

## 2024-05-19 DIAGNOSIS — M4807 Spinal stenosis, lumbosacral region: Secondary | ICD-10-CM | POA: Diagnosis not present

## 2024-05-19 DIAGNOSIS — G4700 Insomnia, unspecified: Secondary | ICD-10-CM | POA: Diagnosis not present

## 2024-05-19 DIAGNOSIS — G894 Chronic pain syndrome: Secondary | ICD-10-CM | POA: Diagnosis not present

## 2024-05-19 DIAGNOSIS — M47897 Other spondylosis, lumbosacral region: Secondary | ICD-10-CM | POA: Diagnosis not present

## 2024-06-09 DIAGNOSIS — E669 Obesity, unspecified: Secondary | ICD-10-CM | POA: Diagnosis not present

## 2024-06-09 DIAGNOSIS — I10 Essential (primary) hypertension: Secondary | ICD-10-CM | POA: Diagnosis not present

## 2024-06-09 DIAGNOSIS — R351 Nocturia: Secondary | ICD-10-CM | POA: Diagnosis not present

## 2024-06-09 DIAGNOSIS — Z299 Encounter for prophylactic measures, unspecified: Secondary | ICD-10-CM | POA: Diagnosis not present

## 2024-06-09 DIAGNOSIS — B356 Tinea cruris: Secondary | ICD-10-CM | POA: Diagnosis not present

## 2024-07-22 ENCOUNTER — Encounter (INDEPENDENT_AMBULATORY_CARE_PROVIDER_SITE_OTHER): Payer: Self-pay | Admitting: *Deleted

## 2024-07-31 ENCOUNTER — Other Ambulatory Visit: Payer: Self-pay | Admitting: Physical Medicine and Rehabilitation

## 2024-07-31 DIAGNOSIS — M5412 Radiculopathy, cervical region: Secondary | ICD-10-CM
